# Patient Record
Sex: Female | Born: 1960 | Race: White | Hispanic: No | Marital: Married | Smoking: Never smoker
Health system: Southern US, Community
[De-identification: ages and names within clinical notes are randomized; demographics above are authoritative.]

## PROBLEM LIST (undated history)

## (undated) DIAGNOSIS — Z87442 Personal history of urinary calculi: Secondary | ICD-10-CM

## (undated) DIAGNOSIS — E785 Hyperlipidemia, unspecified: Secondary | ICD-10-CM

## (undated) DIAGNOSIS — C44602 Unspecified malignant neoplasm of skin of right upper limb, including shoulder: Secondary | ICD-10-CM

## (undated) DIAGNOSIS — B019 Varicella without complication: Secondary | ICD-10-CM

## (undated) DIAGNOSIS — J101 Influenza due to other identified influenza virus with other respiratory manifestations: Secondary | ICD-10-CM

## (undated) HISTORY — DX: Hyperlipidemia, unspecified: E78.5

## (undated) HISTORY — DX: Personal history of urinary calculi: Z87.442

## (undated) HISTORY — PX: TUBAL LIGATION: SHX77

## (undated) HISTORY — DX: Varicella without complication: B01.9

---
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
## 2014-07-20 HISTORY — PX: ABLATION: SHX5711

## 2015-08-08 LAB — HM PAP SMEAR: HM PAP: NORMAL

## 2015-08-21 HISTORY — PX: SKIN CANCER EXCISION: SHX779

## 2015-08-26 LAB — HM COLONOSCOPY

## 2016-11-05 ENCOUNTER — Encounter: Payer: Self-pay | Admitting: Primary Care

## 2016-11-05 ENCOUNTER — Ambulatory Visit (INDEPENDENT_AMBULATORY_CARE_PROVIDER_SITE_OTHER): Payer: Managed Care, Other (non HMO) | Admitting: Primary Care

## 2016-11-05 VITALS — BP 126/84 | HR 63 | Temp 97.9°F | Ht 67.25 in | Wt 189.8 lb

## 2016-11-05 DIAGNOSIS — E782 Mixed hyperlipidemia: Secondary | ICD-10-CM

## 2016-11-05 DIAGNOSIS — E785 Hyperlipidemia, unspecified: Secondary | ICD-10-CM | POA: Insufficient documentation

## 2016-11-05 DIAGNOSIS — G8929 Other chronic pain: Secondary | ICD-10-CM | POA: Diagnosis not present

## 2016-11-05 DIAGNOSIS — M25511 Pain in right shoulder: Secondary | ICD-10-CM

## 2016-11-05 NOTE — Assessment & Plan Note (Signed)
Intermittent for 2 years, constant for 6 months. Given consistent symptoms without improvement, will send to ortho for further evaluation. She has CD of MRI from early 2018.

## 2016-11-05 NOTE — Assessment & Plan Note (Signed)
Slightly above goal. Will have her work on diet and exercise.  Check lipids later this Summer during CPE.

## 2016-11-05 NOTE — Patient Instructions (Signed)
Stop by the front desk and speak with either Rosaria Ferries or Shirlean Mylar regarding your referral to Orthopedics.  Please schedule a physical with me in a few months. You may also schedule a lab only appointment 3-4 days prior. We will discuss your lab results in detail during your physical.  It was a pleasure to meet you today! Please don't hesitate to call me with any questions. Welcome to [facility]!

## 2016-11-05 NOTE — Progress Notes (Signed)
Subjective:    Patient ID: Brianna Tanner, female    DOB: 1960/10/30, 56 y.o.   MRN: [id number]  HPI  [name] is a 56 year old female who presents today to establish care and discuss the problems mentioned below. Will obtain old records. Her last physical was in March   1) Shoulder Pain: Chronic pain to right shoulder for the past 1-2 years, since October 2017 her pain has been consistent. She underwent physical therapy several years ago with improvement. She underwent MRI about 1 month ago which showed frozen shoulder. She's had no improvement with home exercises, Meloxicam without much improvement. Her prior PCP recommended she see an orthopedist for further evaluation given lack of improvement. She moved from [place] 1 month ago. She denies numbness/tingling, weakness.   2) Hyperlipidemia: History of borderline levels in the past. Completed a biometric screening for work in January 2018 with TC of 252, LDL of 158, HDL of 58, Triglycerides if 198. She is working to improve her diet and plans on exercising.  Poor diet in January 2018, improved over the last 1 month. Her diet currently consists of:  Breakfast: Coffee, fruit, granola cereal, cheese, eggs/bacon Lunch: Chicken, vegetables. Dinner: Chicken, vegetables. Snacks: Occasionally, nuts, fruit Desserts: Has not had sweets in 1 month Beverages: Coffee, water, un-sweet tea  Exercise: She does not currently exercise.    Review of Systems  Respiratory: Negative for shortness of breath.   Cardiovascular: Negative for chest pain.  Musculoskeletal: Positive for arthralgias.  Neurological: Negative for weakness and numbness.       Past Medical History:  Diagnosis Date  . Chickenpox   . History of kidney stones   . Hyperlipidemia      Social History   Social History  . Marital status: Married    Spouse name: N/A  . Number of children: N/A  . Years of education: N/A   Occupational History  . Not on file.     Social History Main Topics  . Smoking status: Never Smoker  . Smokeless tobacco: Never Used  . Alcohol use Yes  . Drug use: Unknown  . Sexual activity: Not on file   Other Topics Concern  . Not on file   Social History Narrative   Married.   3 children.   Moved from [place].   Works at [employer]   Enjoys antiquing, hiking.     Past Surgical History:  Procedure Laterality Date  . ABLATION  2016  . SKIN CANCER EXCISION  08/2015    Family History  Problem Relation Age of Onset  . Uterine cancer Mother   . Arthritis Father   . Stroke Father   . Hypertension Father   . Hyperlipidemia Brother   . Stroke Brother     No Known Allergies  No current outpatient prescriptions on file prior to visit.   No current facility-administered medications on file prior to visit.     BP 126/84   Pulse 63   Temp 97.9 F (36.6 C) (Oral)   Ht 5' 7.25" (1.708 m)   Wt 189 lb 12.8 oz (86.1 kg)   SpO2 95%   BMI 29.51 kg/m    Objective:   Physical Exam  Constitutional: She appears well-nourished.  Neck: Neck supple.  Cardiovascular: Normal rate and regular rhythm.   Pulmonary/Chest: Effort normal and breath sounds normal.  Musculoskeletal:       Right shoulder: She exhibits decreased range of motion and pain.  Skin: Skin is  warm and dry.          Assessment & Plan:

## 2017-02-25 ENCOUNTER — Other Ambulatory Visit: Payer: Self-pay | Admitting: Primary Care

## 2017-02-25 DIAGNOSIS — E785 Hyperlipidemia, unspecified: Secondary | ICD-10-CM

## 2017-03-04 ENCOUNTER — Encounter (INDEPENDENT_AMBULATORY_CARE_PROVIDER_SITE_OTHER): Payer: Self-pay

## 2017-03-04 ENCOUNTER — Other Ambulatory Visit (INDEPENDENT_AMBULATORY_CARE_PROVIDER_SITE_OTHER): Payer: Managed Care, Other (non HMO)

## 2017-03-04 DIAGNOSIS — E785 Hyperlipidemia, unspecified: Secondary | ICD-10-CM

## 2017-03-04 LAB — LIPID PANEL
CHOLESTEROL: 187 mg/dL (ref 0–200)
HDL: 45.8 mg/dL (ref >39.00)
LDL Cholesterol: 117 mg/dL — ABNORMAL HIGH (ref 0–99)
NonHDL: 141.27
TRIGLYCERIDES: 123 mg/dL (ref 0.0–149.0)
Total CHOL/HDL Ratio: 4
VLDL: 24.6 mg/dL (ref 0.0–40.0)

## 2017-03-04 LAB — COMPREHENSIVE METABOLIC PANEL
ALBUMIN: 4.1 g/dL (ref 3.5–5.2)
ALK PHOS: 34 U/L — AB (ref 39–117)
ALT: 11 U/L (ref 0–35)
AST: 14 U/L (ref 0–37)
BILIRUBIN TOTAL: 0.5 mg/dL (ref 0.2–1.2)
BUN: 14 mg/dL (ref 6–23)
CALCIUM: 9.1 mg/dL (ref 8.4–10.5)
CO2: 28 mEq/L (ref 19–32)
Chloride: 105 mEq/L (ref 96–112)
Creatinine, Ser: 0.69 mg/dL (ref 0.40–1.20)
GFR: 93.52 mL/min (ref >60.00)
Glucose, Bld: 93 mg/dL (ref 70–99)
POTASSIUM: 3.7 meq/L (ref 3.5–5.1)
Sodium: 139 mEq/L (ref 135–145)
TOTAL PROTEIN: 6.2 g/dL (ref 6.0–8.3)

## 2017-03-09 ENCOUNTER — Encounter: Payer: Managed Care, Other (non HMO) | Admitting: Primary Care

## 2017-03-17 ENCOUNTER — Encounter: Payer: Self-pay | Admitting: Primary Care

## 2017-03-17 ENCOUNTER — Ambulatory Visit (INDEPENDENT_AMBULATORY_CARE_PROVIDER_SITE_OTHER): Payer: Managed Care, Other (non HMO) | Admitting: Primary Care

## 2017-03-17 VITALS — BP 118/78 | HR 66 | Temp 98.0°F | Ht 67.0 in | Wt 175.8 lb

## 2017-03-17 DIAGNOSIS — Z0000 Encounter for general adult medical examination without abnormal findings: Secondary | ICD-10-CM

## 2017-03-17 DIAGNOSIS — E782 Mixed hyperlipidemia: Secondary | ICD-10-CM | POA: Diagnosis not present

## 2017-03-17 DIAGNOSIS — Z0001 Encounter for general adult medical examination with abnormal findings: Secondary | ICD-10-CM | POA: Insufficient documentation

## 2017-03-17 DIAGNOSIS — Z23 Encounter for immunization: Secondary | ICD-10-CM

## 2017-03-17 NOTE — Progress Notes (Signed)
Subjective:    Patient ID: Brianna Tanner, female    DOB: 07/13/1961, 56 y.o.   MRN: [id number]  HPI  [name] is a 56 year old female who presents today for complete physical.  Immunizations: -Tetanus: Believes it's been over 10 years. -Influenza: Due this season.   Diet: She endorses a healthy diet. She's been participating in [employer] watchers for the past 2 months. Breakfast: Yogurt, fruit, honey, english muffin with egg/turkey Lunch: Salads, chicken, chili, fruit, veggies Dinner: Meat, pasta, chili, starch  Snacks: Fruit, nuts, chips, some cheese Desserts: 1-2 times monthly with cake/cheesecake; fruit Beverages: Water, unsweet tea, coffee  Exercise: She does not regularly exercise Eye exam: Completed in 2018 Dental exam: Due. Colonoscopy: Completed in 2017, due in 10 years Pap Smear: Completed in 2017, normal Mammogram: Follows with GYN.  Review of Systems  Constitutional: Negative for unexpected weight change.  HENT: Negative for rhinorrhea.   Respiratory: Negative for cough and shortness of breath.   Cardiovascular: Negative for chest pain.  Gastrointestinal: Negative for constipation and diarrhea.  Genitourinary: Negative for difficulty urinating and menstrual problem.  Musculoskeletal: Negative for arthralgias and myalgias.  Skin: Negative for rash.  Allergic/Immunologic: Negative for environmental allergies.  Neurological: Negative for dizziness, numbness and headaches.  Psychiatric/Behavioral:       Denies anxiety and depression       Past Medical History:  Diagnosis Date  . Chickenpox   . History of kidney stones   . Hyperlipidemia      Social History   Social History  . Marital status: Married    Spouse name: N/A  . Number of children: N/A  . Years of education: N/A   Occupational History  . Not on file.   Social History Main Topics  . Smoking status: Never Smoker  . Smokeless tobacco: Never Used  . Alcohol use Yes  . Drug  use: Unknown  . Sexual activity: Not on file   Other Topics Concern  . Not on file   Social History Narrative   Married.   3 children.   Moved from [place].   Works at [employer]   Enjoys antiquing, hiking.     Past Surgical History:  Procedure Laterality Date  . ABLATION  2016  . SKIN CANCER EXCISION  08/2015    Family History  Problem Relation Age of Onset  . Uterine cancer Mother   . Arthritis Father   . Stroke Father   . Hypertension Father   . Hyperlipidemia Brother   . Stroke Brother     No Known Allergies  No current outpatient prescriptions on file prior to visit.   No current facility-administered medications on file prior to visit.     BP 118/78   Pulse 66   Temp 98 F (36.7 C) (Oral)   Ht 5\' 7"  (1.702 m)   Wt 175 lb 12.8 oz (79.7 kg)   SpO2 98%   BMI 27.53 kg/m    Objective:   Physical Exam  Constitutional: She is oriented to person, place, and time. She appears well-nourished.  HENT:  Right Ear: Tympanic membrane and ear canal normal.  Left Ear: Tympanic membrane and ear canal normal.  Nose: Nose normal.  Mouth/Throat: Oropharynx is clear and moist.  Eyes: Pupils are equal, round, and reactive to light. Conjunctivae and EOM are normal.  Neck: Neck supple. No thyromegaly present.  Cardiovascular: Normal rate and regular rhythm.   No murmur heard. Pulmonary/Chest: Effort normal and breath sounds normal.  She has no rales.  Abdominal: Soft. Bowel sounds are normal. There is no tenderness.  Musculoskeletal: Normal range of motion.  Lymphadenopathy:    She has no cervical adenopathy.  Neurological: She is alert and oriented to person, place, and time. She has normal reflexes. No cranial nerve deficit.  Skin: Skin is warm and dry. No rash noted.  Psychiatric: She has a normal mood and affect.          Assessment & Plan:

## 2017-03-17 NOTE — Assessment & Plan Note (Signed)
Tetanus due, provided today. Recommended regular exercise, commended her on her weight loss.  Mammogram and Pap UTD. Colonoscopy UTD. Exam unremarkable. Labs unremarkable. Follow up in 1 year.

## 2017-03-17 NOTE — Assessment & Plan Note (Signed)
Improved on recently labs, likely from weight loss through [employer]. Commended her on this success.

## 2017-03-17 NOTE — Addendum Note (Signed)
Addended by: Jacqualin Combes on: 03/17/2017 12:45 PM   Modules accepted: Orders

## 2017-03-17 NOTE — Patient Instructions (Signed)
Start exercising. You should be getting 150 minutes of moderate intensity exercise weekly.  Continue your efforts towards a healthy diet. Congratulations on your weight loss!  You were provided with a tetanus vaccination which will cover you for 10 years.  Follow up in 1 year for your annual exam or sooner if needed.  It was a pleasure to see you today!

## 2017-06-17 ENCOUNTER — Ambulatory Visit: Payer: Managed Care, Other (non HMO) | Admitting: Family Medicine

## 2017-06-17 ENCOUNTER — Encounter: Payer: Self-pay | Admitting: Family Medicine

## 2017-06-17 VITALS — BP 120/80 | HR 90 | Temp 98.5°F | Ht 67.0 in | Wt 175.5 lb

## 2017-06-17 DIAGNOSIS — J329 Chronic sinusitis, unspecified: Secondary | ICD-10-CM

## 2017-06-17 MED ORDER — BENZONATATE 100 MG PO CAPS: 100.0000 mg | ORAL_CAPSULE | Freq: Three times a day (TID) | ORAL | 0 refills | Status: DC | PRN

## 2017-06-17 MED ORDER — AMOXICILLIN-POT CLAVULANATE 875-125 MG PO TABS: 1.0000 | ORAL_TABLET | Freq: Two times a day (BID) | ORAL | 0 refills | Status: DC

## 2017-06-17 NOTE — Patient Instructions (Signed)
Take the antibiotic (Augmentin )as instructed.  Use the Tessalon as needed for cough per instructions.  I hope you are feeling better soon! Seek care promptly if your symptoms worsen, new concerns arise or you are not improving with treatment.

## 2017-06-17 NOTE — Progress Notes (Signed)
   HPI:  Acute visit for respiratory illness: -started:3-4 weeks ago with a cold nasal congestion, sore throat, cough -never went away and then got worse with thicker yellow to green mucus, sinus pressure and pain, malaise -denies:fever, SOB, NVD, tooth pain -has tried: nothing -sick contacts/travel/risks: no reported flu, strep or tick exposure - husband had it but symptoms resolved  ROS: See pertinent positives and negatives per HPI.  Past Medical History:  Diagnosis Date  . Chickenpox   . History of kidney stones   . Hyperlipidemia     Past Surgical History:  Procedure Laterality Date  . ABLATION  2016  . SKIN CANCER EXCISION  08/2015    Family History  Problem Relation Age of Onset  . Uterine cancer Mother   . Arthritis Father   . Stroke Father   . Hypertension Father   . Hyperlipidemia Brother   . Stroke Brother     Social History   Socioeconomic History  . Marital status: Married    Spouse name: None  . Number of children: None  . Years of education: None  . Highest education level: None  Social Needs  . Financial resource strain: None  . Food insecurity - worry: None  . Food insecurity - inability: None  . Transportation needs - medical: None  . Transportation needs - non-medical: None  Occupational History  . None  Tobacco Use  . Smoking status: Never Smoker  . Smokeless tobacco: Never Used  Substance and Sexual Activity  . Alcohol use: Yes  . Drug use: None  . Sexual activity: None  Other Topics Concern  . None  Social History Narrative   Married.   3 children.   Moved from [place].   Works at [employer]   Enjoys antiquing, hiking.      Current Outpatient Medications:  .  amoxicillin-clavulanate (AUGMENTIN) 875-125 MG tablet, Take 1 tablet by mouth 2 (two) times daily., Disp: 20 tablet, Rfl: 0 .  benzonatate (TESSALON PERLES) 100 MG capsule, Take 1 capsule (100 mg total) by mouth 3 (three) times daily as needed., Disp: 20 capsule, Rfl:  0  EXAM:  Vitals:   06/17/17 1558  BP: 120/80  Pulse: 90  Temp: 98.5 F (36.9 C)  SpO2: 97%    Body mass index is 27.49 kg/m.  GENERAL: vitals reviewed and listed above, alert, oriented, appears well hydrated and in no acute distress  HEENT: atraumatic, conjunttiva clear, no obvious abnormalities on inspection of external nose and ears, normal appearance of ear canals and TMs, thicknasal congestion, mild post oropharyngeal erythema with PND, no tonsillar edema or exudate, no sinus TTP  NECK: no obvious masses on inspection  LUNGS: clear to auscultation bilaterally, no wheezes, rales or rhonchi, good air movement  CV: HRRR, no peripheral edema  MS: moves all extremities without noticeable abnormality  PSYCH: pleasant and cooperative, no obvious depression or anxiety  ASSESSMENT AND PLAN:  Discussed the following assessment and plan:  Sinusitis, unspecified chronicity, unspecified location  -Antibiotic after discussion of risks/benefits -Tessalon for cough -of course, we advised to return or notify a doctor immediately if symptoms worsen or persist or new concerns arise.    Patient Instructions  Take the antibiotic (Augmentin )as instructed.  Use the Tessalon as needed for cough per instructions.  I hope you are feeling better soon! Seek care promptly if your symptoms worsen, new concerns arise or you are not improving with treatment.     Colin Benton R., DO

## 2017-06-22 ENCOUNTER — Ambulatory Visit (INDEPENDENT_AMBULATORY_CARE_PROVIDER_SITE_OTHER)
Admission: RE | Admit: 2017-06-22 | Discharge: 2017-06-22 | Disposition: A | Discharge status: Home / Self Care | Payer: Managed Care, Other (non HMO) | Source: Ambulatory Visit | Attending: Family Medicine | Admitting: Family Medicine

## 2017-06-22 ENCOUNTER — Encounter: Payer: Self-pay | Admitting: Family Medicine

## 2017-06-22 ENCOUNTER — Ambulatory Visit: Payer: Managed Care, Other (non HMO) | Admitting: Family Medicine

## 2017-06-22 VITALS — BP 100/60 | HR 90 | Temp 99.0°F | Ht 67.0 in | Wt 174.1 lb

## 2017-06-22 DIAGNOSIS — R059 Cough, unspecified: Secondary | ICD-10-CM

## 2017-06-22 DIAGNOSIS — R05 Cough: Secondary | ICD-10-CM | POA: Diagnosis not present

## 2017-06-22 LAB — POC INFLUENZA A&B (BINAX/QUICKVUE)
Influenza A, POC: NEGATIVE
Influenza B, POC: NEGATIVE

## 2017-06-22 MED ORDER — PREDNISONE 20 MG PO TABS: 40.0000 mg | ORAL_TABLET | Freq: Every day | ORAL | 0 refills | Status: DC

## 2017-06-22 MED ORDER — BENZONATATE 100 MG PO CAPS: 100.0000 mg | ORAL_CAPSULE | Freq: Three times a day (TID) | ORAL | 0 refills | Status: DC | PRN

## 2017-06-22 NOTE — Addendum Note (Signed)
Addended by: Agnes Lawrence on: 06/22/2017 03:58 PM   Modules accepted: Orders

## 2017-06-22 NOTE — Progress Notes (Signed)
HPI:  Acute visit for respiratory illness: -started: 4-5 days ago  -symptoms: Think she felt better on the antibiotic, but then 4-5 days ago seem to get cough with wheeziness to the cough per her report, the cough never completely went away, she also had some fullness in the right ear and at one point felt like she pulsing sound in that ear, she had a headache, she threw up once for 5 days ago and felt like she had some fevers for a few nights had was 100, still feels like has a runny nose times -denies:fever, SOB, NVD, tooth pain -has tried: see above -sick contacts/travel/risks: no reported flu, strep or tick exposure  ROS: See pertinent positives and negatives per HPI.  Past Medical History:  Diagnosis Date  . Chickenpox   . History of kidney stones   . Hyperlipidemia     Past Surgical History:  Procedure Laterality Date  . ABLATION  2016  . SKIN CANCER EXCISION  08/2015    Family History  Problem Relation Age of Onset  . Uterine cancer Mother   . Arthritis Father   . Stroke Father   . Hypertension Father   . Hyperlipidemia Brother   . Stroke Brother     Social History   Socioeconomic History  . Marital status: Married    Spouse name: None  . Number of children: None  . Years of education: None  . Highest education level: None  Social Needs  . Financial resource strain: None  . Food insecurity - worry: None  . Food insecurity - inability: None  . Transportation needs - medical: None  . Transportation needs - non-medical: None  Occupational History  . None  Tobacco Use  . Smoking status: Never Smoker  . Smokeless tobacco: Never Used  Substance and Sexual Activity  . Alcohol use: Yes  . Drug use: None  . Sexual activity: None  Other Topics Concern  . None  Social History Narrative   Married.   3 children.   Moved from [place].   Works at [employer]   Enjoys antiquing, hiking.      Current Outpatient Medications:  .  amoxicillin-clavulanate  (AUGMENTIN) 875-125 MG tablet, Take 1 tablet by mouth 2 (two) times daily., Disp: 20 tablet, Rfl: 0 .  benzonatate (TESSALON PERLES) 100 MG capsule, Take 1 capsule (100 mg total) by mouth 3 (three) times daily as needed., Disp: 20 capsule, Rfl: 0 .  predniSONE (DELTASONE) 20 MG tablet, Take 2 tablets (40 mg total) by mouth daily with breakfast., Disp: 8 tablet, Rfl: 0  EXAM:  Vitals:   06/22/17 1516  BP: 100/60  Pulse: 90  Temp: 99 F (37.2 C)  SpO2: 96%    Body mass index is 27.27 kg/m.  GENERAL: vitals reviewed and listed above, alert, oriented, appears well hydrated and in no acute distress  HEENT: atraumatic, conjunttiva clear, no obvious abnormalities on inspection of external nose and ears, normal appearance of ear canals and TMs, clear nasal congestion, mild post oropharyngeal erythema with PND, no tonsillar edema or exudate, no sinus TTP  NECK: no obvious masses on inspection  LUNGS: clear to auscultation bilaterally, no wheezes, rales or rhonchi, good air movement  CV: HRRR, no peripheral edema  MS: moves all extremities without noticeable abnormality  PSYCH: pleasant and cooperative, no obvious depression or anxiety  ASSESSMENT AND PLAN:  Discussed the following assessment and plan:  Cough - Plan: DG Chest 2 View  -It sounds like she  might of gotten another illness, perhaps a flu or another viral illness -We will get a chest x-ray to make sure there is no lower respiratory tract infection, will treat with another antibiotic if this is a case -Prednisone for the cough which has lingered -Rapid flu test today - thought does not want tamiflu if positive, more diagnostic -Clear effusion of the right ear is likely because of the sounds in her ear, though did advise ear nose and throat evaluation if she has any more pulsatile symptoms in that ear, number provided to call -Like the Tessalon for cough, we will give her a refill on this -Advised follow-up with her primary  doctor next week for recheck -of course, we advised to return or notify a doctor immediately if symptoms worsen or persist or new concerns arise.    Patient Instructions  BEFORE YOU LEAVE: -xray sheet -flu test -follow up: next week with your doctor in about 1 week  Go get the xray.  Take the prednisone as prescribed.  Tessalon as needed.  See the ear, nose and throat specialist if any further symptoms with the ear.  I hope you are feeling better soon! Seek care promptly if your symptoms worsen, new concerns arise or you are not improving with treatment.      Colin Benton R., DO

## 2017-06-22 NOTE — Patient Instructions (Signed)
BEFORE YOU LEAVE: -xray sheet -flu test -follow up: next week with your doctor in about 1 week  Go get the xray.  Take the prednisone as prescribed.  Tessalon as needed.  See the ear, nose and throat specialist if any further symptoms with the ear.  I hope you are feeling better soon! Seek care promptly if your symptoms worsen, new concerns arise or you are not improving with treatment.

## 2017-07-28 ENCOUNTER — Encounter: Payer: Self-pay | Admitting: Family

## 2017-07-28 ENCOUNTER — Ambulatory Visit (INDEPENDENT_AMBULATORY_CARE_PROVIDER_SITE_OTHER): Payer: Managed Care, Other (non HMO) | Admitting: Family

## 2017-07-28 ENCOUNTER — Telehealth: Payer: Self-pay | Admitting: Family

## 2017-07-28 VITALS — BP 104/72 | HR 99 | Temp 98.6°F | Ht 67.0 in | Wt 177.0 lb

## 2017-07-28 DIAGNOSIS — H5711 Ocular pain, right eye: Secondary | ICD-10-CM

## 2017-07-28 NOTE — Telephone Encounter (Signed)
Patient came to office today with concerns that she has scratched her eye or "has something in it." Complaining of pain, increased watering, light sensitivity; I cancelled her appointment here today and refunded her money; recommended that she see her eye doctor ASAP. She agrees to call and schedule appointment as soon as she leaves our office.

## 2017-07-29 NOTE — Progress Notes (Signed)
error 

## 2017-07-29 NOTE — Patient Instructions (Signed)
Scheduled in error.

## 2018-10-05 ENCOUNTER — Encounter: Payer: Self-pay | Admitting: Family Medicine

## 2018-10-05 ENCOUNTER — Other Ambulatory Visit: Payer: Self-pay

## 2018-10-05 ENCOUNTER — Ambulatory Visit: Payer: 59 | Admitting: Family Medicine

## 2018-10-05 VITALS — BP 122/82 | HR 73 | Temp 98.5°F | Ht 67.0 in | Wt 189.0 lb

## 2018-10-05 DIAGNOSIS — R059 Cough, unspecified: Secondary | ICD-10-CM

## 2018-10-05 DIAGNOSIS — J101 Influenza due to other identified influenza virus with other respiratory manifestations: Secondary | ICD-10-CM

## 2018-10-05 DIAGNOSIS — R05 Cough: Secondary | ICD-10-CM

## 2018-10-05 DIAGNOSIS — R52 Pain, unspecified: Secondary | ICD-10-CM | POA: Diagnosis not present

## 2018-10-05 HISTORY — DX: Influenza due to other identified influenza virus with other respiratory manifestations: J10.1

## 2018-10-05 LAB — POC INFLUENZA A&B (BINAX/QUICKVUE)
Influenza A, POC: POSITIVE — AB
Influenza B, POC: NEGATIVE

## 2018-10-05 NOTE — Patient Instructions (Addendum)
Flu swab today very faintly positive for flu A.  Push fluids and plenty of rest. Please let us know if you are not improving as expected, or if you have high fevers (>101.5) or difficulty swallowing or worsening productive cough.  Call clinic with questions.  Good to see you today. I hope you start feeling better soon.

## 2018-10-05 NOTE — Progress Notes (Signed)
BP 122/82 (BP Location: Left Arm, Patient Position: Sitting, Cuff Size: Normal)   Pulse 73   Temp 98.5 F (36.9 C) (Oral)   Ht 5\' 7"  (1.702 m)   Wt 189 lb (85.7 kg)   SpO2 96%   BMI 29.60 kg/m    CC: cough Subjective:    Patient ID: Brianna Tanner, female    DOB: 03/30/1961, 58 y.o.   MRN: [id number]  HPI: Brianna Tanner is a 58 y.o. female presenting on 10/05/2018 for Cough (C/o cough, chest congestion, body aches and HA. Sxs started 1 wk ago  Pt recently traveled to [facility]. Pt accompanied by husband, Richard. )   Here with husband who is also seen.   1 wk h/o dry non productive cough, chest congestion, back pain, headache. Last 2 nights of cough seem to be improving. Some dyspnea with cough. Coughing fits started yesterday. Body aches present as well. Some sneezing but not significant nasal congestion. Chest > head congestion.   Has been trying cough drops, nyquil, mucinex with benefit.   No fevers/chills, ear or tooth pain, ST, PNdrainage, wheezing.  Husband sick as well. No h/o asthma. No smokers at home.   Recent sudden 1d trip to [place], [place] (there and arrived back at 1am this morning) to pick up sister in law who was stuck in [facility].      Relevant past medical, surgical, family and social history reviewed and updated as indicated. Interim medical history since our last visit reviewed. Allergies and medications reviewed and updated. No outpatient medications prior to visit.   No facility-administered medications prior to visit.      Per HPI unless specifically indicated in ROS section below Review of Systems Objective:    BP 122/82 (BP Location: Left Arm, Patient Position: Sitting, Cuff Size: Normal)   Pulse 73   Temp 98.5 F (36.9 C) (Oral)   Ht 5\' 7"  (1.702 m)   Wt 189 lb (85.7 kg)   SpO2 96%   BMI 29.60 kg/m   Wt Readings from Last 3 Encounters:  10/05/18 189 lb (85.7 kg)  07/28/17 177 lb (80.3 kg)  06/22/17 174 lb 1.6 oz (79 kg)     Physical Exam Vitals signs and nursing note reviewed.  Constitutional:      General: She is not in acute distress.    Appearance: Normal appearance. She is well-developed.  HENT:     Head: Normocephalic and atraumatic.     Right Ear: Hearing, tympanic membrane, ear canal and external ear normal.     Left Ear: Hearing, tympanic membrane, ear canal and external ear normal.     Nose: Congestion (mild) present. No mucosal edema or rhinorrhea.     Right Sinus: No maxillary sinus tenderness or frontal sinus tenderness.     Left Sinus: No maxillary sinus tenderness or frontal sinus tenderness.     Mouth/Throat:     Mouth: Mucous membranes are moist.     Pharynx: Uvula midline. Posterior oropharyngeal erythema present. No oropharyngeal exudate.     Tonsils: No tonsillar abscesses.  Eyes:     General: No scleral icterus.    Conjunctiva/sclera: Conjunctivae normal.     Pupils: Pupils are equal, round, and reactive to light.  Neck:     Musculoskeletal: Normal range of motion and neck supple.  Cardiovascular:     Rate and Rhythm: Normal rate and regular rhythm.     Pulses: Normal pulses.     Heart sounds: Normal heart sounds. No  murmur.  Pulmonary:     Effort: Pulmonary effort is normal. No respiratory distress.     Breath sounds: Normal breath sounds. No wheezing, rhonchi or rales.     Comments: Lungs clear, dry irritative cough present Lymphadenopathy:     Cervical: No cervical adenopathy.  Skin:    General: Skin is warm and dry.     Findings: No rash.  Neurological:     Mental Status: She is alert.       Results for orders placed or performed in visit on 10/05/18  POC Influenza A&B(BINAX/QUICKVUE)  Result Value Ref Range   Influenza A, POC Positive (A) Negative   Influenza B, POC Negative Negative   Assessment & Plan:   Problem List Items Addressed This Visit    Influenza A - Primary    Flu swab very faintly positive for A.  Symptoms ongoing for the past week - likely  outside of window of benefit for tamiflu.  Supportive care reviewed, red fiags to seek further care reviewed. Pt declines cough syrup at this time.        Other Visit Diagnoses    Cough       Relevant Orders   POC Influenza A&B(BINAX/QUICKVUE) (Completed)   Generalized body aches       Relevant Orders   POC Influenza A&B(BINAX/QUICKVUE) (Completed)       No orders of the defined types were placed in this encounter.  Orders Placed This Encounter  Procedures  . POC Influenza A&B(BINAX/QUICKVUE)    Patient Instructions  Flu swab today very faintly positive for flu A.  Push fluids and plenty of rest. Please let us know if you are not improving as expected, or if you have high fevers (>101.5) or difficulty swallowing or worsening productive cough.  Call clinic with questions.  Good to see you today. I hope you start feeling better soon.    Follow up plan: Return if symptoms worsen or fail to improve.  Ria Bush, MD

## 2018-10-05 NOTE — Assessment & Plan Note (Signed)
Flu swab very faintly positive for A.  Symptoms ongoing for the past week - likely outside of window of benefit for tamiflu.  Supportive care reviewed, red fiags to seek further care reviewed. Pt declines cough syrup at this time.

## 2019-04-06 ENCOUNTER — Encounter: Payer: Self-pay | Admitting: Primary Care

## 2019-04-06 ENCOUNTER — Other Ambulatory Visit: Payer: Self-pay

## 2019-04-06 ENCOUNTER — Ambulatory Visit (INDEPENDENT_AMBULATORY_CARE_PROVIDER_SITE_OTHER): Payer: No Typology Code available for payment source | Admitting: Primary Care

## 2019-04-06 ENCOUNTER — Ambulatory Visit: Payer: 59 | Admitting: Primary Care

## 2019-04-06 DIAGNOSIS — H9203 Otalgia, bilateral: Secondary | ICD-10-CM

## 2019-04-06 DIAGNOSIS — H9209 Otalgia, unspecified ear: Secondary | ICD-10-CM | POA: Insufficient documentation

## 2019-04-06 MED ORDER — FLUTICASONE PROPIONATE 50 MCG/ACT NA SUSP: 1.0000 | Freq: Two times a day (BID) | NASAL | 0 refills | Status: DC

## 2019-04-06 NOTE — Progress Notes (Signed)
Subjective:    Patient ID: Brianna Tanner, female    DOB: [birth year], 58 y.o.   MRN: [id number]  HPI  [name] is a 58 year old female who presents today with a chief complaint of otalgia.  Her pain is located to the bilateral ears which began in late February 2020. She describes her symptoms as "throbbing in my ear drums", hearing a "tone" in her ears, pressure and achy feeling to ears. Her ear pressure is constant, throbbing is intermittent. Symptoms will prevent her from sleeping at times.  She's not taken anything OTC for symptoms. Denies history of allergic rhinitis.   Review of Systems  Constitutional: Negative for fever.  HENT: Positive for ear pain. Negative for congestion, ear discharge, postnasal drip and sore throat.   Respiratory: Negative for cough.   Allergic/Immunologic: Negative for environmental allergies.       Past Medical History:  Diagnosis Date  . Chickenpox   . History of kidney stones   . Hyperlipidemia      Social History   Socioeconomic History  . Marital status: Married    Spouse name: Not on file  . Number of children: Not on file  . Years of education: Not on file  . Highest education level: Not on file  Occupational History  . Not on file  Social Needs  . Financial resource strain: Not on file  . Food insecurity    Worry: Not on file    Inability: Not on file  . Transportation needs    Medical: Not on file    Non-medical: Not on file  Tobacco Use  . Smoking status: Never Smoker  . Smokeless tobacco: Never Used  Substance and Sexual Activity  . Alcohol use: Yes  . Drug use: Not on file  . Sexual activity: Not on file  Lifestyle  . Physical activity    Days per week: Not on file    Minutes per session: Not on file  . Stress: Not on file  Relationships  . Social Herbalist on phone: Not on file    Gets together: Not on file    Attends religious service: Not on file    Active member of club or  organization: Not on file    Attends meetings of clubs or organizations: Not on file    Relationship status: Not on file  . Intimate partner violence    Fear of current or ex partner: Not on file    Emotionally abused: Not on file    Physically abused: Not on file    Forced sexual activity: Not on file  Other Topics Concern  . Not on file  Social History Narrative   Married.   3 children.   Moved from [place].   Works at [employer]   Enjoys antiquing, hiking.     Past Surgical History:  Procedure Laterality Date  . ABLATION  2016  . SKIN CANCER EXCISION  08/2015    Family History  Problem Relation Age of Onset  . Uterine cancer Mother   . Arthritis Father   . Stroke Father   . Hypertension Father   . Hyperlipidemia Brother   . Stroke Brother     No Known Allergies  No current outpatient medications on file prior to visit.   No current facility-administered medications on file prior to visit.     BP 128/86   Pulse 67   Temp 97.8 F (36.6 C) (Temporal)  Ht 5\' 7"  (1.702 m)   Wt 188 lb (85.3 kg)   SpO2 96%   BMI 29.44 kg/m    Objective:   Physical Exam  Constitutional: She appears well-nourished. She does not appear ill.  HENT:  Right Ear: Ear canal normal. Tympanic membrane is not erythematous and not bulging.  Left Ear: Tympanic membrane and ear canal normal. Tympanic membrane is not erythematous and not bulging.  Nose: No mucosal edema. Right sinus exhibits no maxillary sinus tenderness and no frontal sinus tenderness. Left sinus exhibits no maxillary sinus tenderness and no frontal sinus tenderness.  Mild cloudy appearance to right TM  Neck: Neck supple.  Cardiovascular: Normal rate and regular rhythm.  Respiratory: Effort normal and breath sounds normal. She has no wheezes.  Skin: Skin is warm and dry.           Assessment & Plan:

## 2019-04-06 NOTE — Assessment & Plan Note (Signed)
Chronic bilaterally since February 2020, persistent since. Exam today without infection or foreign body. Due to cloudy appearance of right TM we will trial daily Flonase with addition of antihistamine as needed.  Rx for Flonase sent to pharmacy. Discussed to add on antihistamine in 1-2 weeks if no improvement.   Consider ENT evaluation if no improvement.

## 2019-04-06 NOTE — Patient Instructions (Signed)
Ear Pain/Pressure: Try using Flonase (fluticasone) nasal spray. Instill 1 spray in each nostril twice daily.   Add in Zyrtec at bedtime in 1-2 weeks if no improvement.  Please update me in 2-3 weeks as discussed.   It was a pleasure to see you today!

## 2019-04-28 ENCOUNTER — Other Ambulatory Visit: Payer: Self-pay | Admitting: Primary Care

## 2019-04-28 DIAGNOSIS — H9203 Otalgia, bilateral: Secondary | ICD-10-CM

## 2019-05-23 ENCOUNTER — Other Ambulatory Visit: Payer: Self-pay | Admitting: *Deleted

## 2019-05-23 DIAGNOSIS — Z20822 Contact with and (suspected) exposure to covid-19: Secondary | ICD-10-CM

## 2019-05-24 LAB — NOVEL CORONAVIRUS, NAA: SARS-CoV-2, NAA: NOT DETECTED

## 2019-05-26 ENCOUNTER — Ambulatory Visit: Payer: No Typology Code available for payment source | Admitting: Family Medicine

## 2019-06-23 DIAGNOSIS — H9203 Otalgia, bilateral: Secondary | ICD-10-CM

## 2019-06-25 MED ORDER — FLUTICASONE PROPIONATE 50 MCG/ACT NA SUSP: 1.0000 | Freq: Two times a day (BID) | NASAL | 0 refills | Status: DC

## 2019-07-08 DIAGNOSIS — H9209 Otalgia, unspecified ear: Secondary | ICD-10-CM

## 2019-07-10 MED ORDER — MOMETASONE FUROATE 50 MCG/ACT NA SUSP: 1.0000 | Freq: Two times a day (BID) | NASAL | 0 refills | Status: DC | PRN

## 2019-07-10 NOTE — Telephone Encounter (Signed)
I have called [facility] and was told insurance would not cover Flonase but would cover Nasonex (mometasone). Ok to change?

## 2019-07-10 NOTE — Telephone Encounter (Signed)
Prescription changed

## 2020-01-24 ENCOUNTER — Ambulatory Visit: Payer: No Typology Code available for payment source | Admitting: Internal Medicine

## 2020-01-24 ENCOUNTER — Encounter: Payer: Self-pay | Admitting: Primary Care

## 2020-01-24 ENCOUNTER — Other Ambulatory Visit (HOSPITAL_COMMUNITY)
Admission: RE | Admit: 2020-01-24 | Discharge: 2020-01-24 | Disposition: A | Discharge status: Home / Self Care | Payer: No Typology Code available for payment source | Source: Ambulatory Visit | Attending: Primary Care | Admitting: Primary Care

## 2020-01-24 ENCOUNTER — Ambulatory Visit: Payer: No Typology Code available for payment source | Admitting: Primary Care

## 2020-01-24 ENCOUNTER — Other Ambulatory Visit: Payer: Self-pay

## 2020-01-24 VITALS — BP 128/82 | HR 70 | Temp 96.1°F | Ht 67.0 in | Wt 191.2 lb

## 2020-01-24 DIAGNOSIS — Z0000 Encounter for general adult medical examination without abnormal findings: Secondary | ICD-10-CM

## 2020-01-24 DIAGNOSIS — S40861A Insect bite (nonvenomous) of right upper arm, initial encounter: Secondary | ICD-10-CM | POA: Insufficient documentation

## 2020-01-24 DIAGNOSIS — Z23 Encounter for immunization: Secondary | ICD-10-CM

## 2020-01-24 DIAGNOSIS — W57XXXA Bitten or stung by nonvenomous insect and other nonvenomous arthropods, initial encounter: Secondary | ICD-10-CM

## 2020-01-24 DIAGNOSIS — E782 Mixed hyperlipidemia: Secondary | ICD-10-CM | POA: Diagnosis not present

## 2020-01-24 DIAGNOSIS — Z124 Encounter for screening for malignant neoplasm of cervix: Secondary | ICD-10-CM | POA: Diagnosis present

## 2020-01-24 DIAGNOSIS — Z1231 Encounter for screening mammogram for malignant neoplasm of breast: Secondary | ICD-10-CM

## 2020-01-24 DIAGNOSIS — Z114 Encounter for screening for human immunodeficiency virus [HIV]: Secondary | ICD-10-CM

## 2020-01-24 DIAGNOSIS — Z1159 Encounter for screening for other viral diseases: Secondary | ICD-10-CM | POA: Diagnosis not present

## 2020-01-24 LAB — CBC
HCT: 41.7 % (ref 36.0–46.0)
Hemoglobin: 14.2 g/dL (ref 12.0–15.0)
MCHC: 34 g/dL (ref 30.0–36.0)
MCV: 90.9 fl (ref 78.0–100.0)
Platelets: 197 10*3/uL (ref 150.0–400.0)
RBC: 4.59 Mil/uL (ref 3.87–5.11)
RDW: 12.8 % (ref 11.5–15.5)
WBC: 3.8 10*3/uL — ABNORMAL LOW (ref 4.0–10.5)

## 2020-01-24 LAB — COMPREHENSIVE METABOLIC PANEL
ALT: 12 U/L (ref 0–35)
AST: 12 U/L (ref 0–37)
Albumin: 4.9 g/dL (ref 3.5–5.2)
Alkaline Phosphatase: 42 U/L (ref 39–117)
BUN: 17 mg/dL (ref 6–23)
CO2: 28 mEq/L (ref 19–32)
Calcium: 9.4 mg/dL (ref 8.4–10.5)
Chloride: 103 mEq/L (ref 96–112)
Creatinine, Ser: 0.61 mg/dL (ref 0.40–1.20)
GFR: 100.41 mL/min (ref >60.00)
Glucose, Bld: 92 mg/dL (ref 70–99)
Potassium: 4 mEq/L (ref 3.5–5.1)
Sodium: 140 mEq/L (ref 135–145)
Total Bilirubin: 0.5 mg/dL (ref 0.2–1.2)
Total Protein: 6.9 g/dL (ref 6.0–8.3)

## 2020-01-24 LAB — LIPID PANEL
Cholesterol: 232 mg/dL — ABNORMAL HIGH (ref 0–200)
HDL: 48.9 mg/dL (ref >39.00)
NonHDL: 183.55
Total CHOL/HDL Ratio: 5
Triglycerides: 241 mg/dL — ABNORMAL HIGH (ref 0.0–149.0)
VLDL: 48.2 mg/dL — ABNORMAL HIGH (ref 0.0–40.0)

## 2020-01-24 LAB — LDL CHOLESTEROL, DIRECT: Direct LDL: 139 mg/dL

## 2020-01-24 NOTE — Addendum Note (Signed)
Addended by: Jacqualin Combes on: 01/24/2020 04:32 PM   Modules accepted: Orders

## 2020-01-24 NOTE — Addendum Note (Signed)
Addended by: Pilar Grammes on: 01/24/2020 12:56 PM   Modules accepted: Orders

## 2020-01-24 NOTE — Assessment & Plan Note (Signed)
Repeat lipid panel pending today. 

## 2020-01-24 NOTE — Progress Notes (Signed)
Subjective:    Patient ID: Brianna Tanner, female    DOB: 1961-02-02, 59 y.o.   MRN: [id number]  HPI  This visit occurred during the SARS-CoV-2 public health emergency.  Safety protocols were in place, including screening questions prior to the visit, additional usage of staff PPE, and extensive cleaning of exam room while observing appropriate contact time as indicated for disinfecting solutions.   Brianna Tanner is a 59 year old female who presents today with a chief complaint of insect bite and for complete physical. She originally made her appointment for an insect bite, but the bite site has nearly resolved.   Immunizations: -Tetanus: Completed in 2018 -Influenza: Did not complete last season -Shingles: Never completed -Covid-19: Has not completed  Diet: She endorses a fair diet. Exercise: Some walking.  Eye exam: Completed in 2021 Dental exam: Completes annually   Pap Smear: Completed in 2017 Mammogram: Due Colonoscopy: Completed in 2017, due in 2027 Hep C Screen: Due   She was stung by something four days ago while outdoors from the grocery store. Since then she's noticed mild redness, moderate itching, mild tenderness.   She's applied Cortisone cream with some improvement. She denies fevers. Her rash and the bite site have nearly resolved. She denies wheezing, throat tightness, shortness of breath.   Review of Systems  Constitutional: Negative for unexpected weight change.  HENT: Negative for rhinorrhea.   Respiratory: Negative for cough and shortness of breath.   Cardiovascular: Negative for chest pain.  Gastrointestinal: Negative for constipation and diarrhea.  Genitourinary: Negative for difficulty urinating.  Musculoskeletal: Positive for arthralgias.  Skin: Negative for rash.  Allergic/Immunologic: Negative for environmental allergies.  Neurological: Negative for dizziness, numbness and headaches.  Psychiatric/Behavioral: The patient is not  nervous/anxious.        Past Medical History:  Diagnosis Date   Chickenpox    History of kidney stones    Hyperlipidemia    Influenza A 10/05/2018     Social History   Socioeconomic History   Marital status: Married    Spouse name: Not on file   Number of children: Not on file   Years of education: Not on file   Highest education level: Not on file  Occupational History   Not on file  Tobacco Use   Smoking status: Never Smoker   Smokeless tobacco: Never Used  Substance and Sexual Activity   Alcohol use: Yes   Drug use: Not on file   Sexual activity: Not on file  Other Topics Concern   Not on file  Social History Narrative   Married.   3 children.   Moved from [place].   Works at [employer]   Enjoys antiquing, hiking.    Social Determinants of Health   Financial Resource Strain:    Difficulty of Paying Living Expenses:   Food Insecurity:    Worried About Charity fundraiser in the Last Year:    Arboriculturist in the Last Year:   Transportation Needs:    Film/video editor (Medical):    Lack of Transportation (Non-Medical):   Physical Activity:    Days of Exercise per Week:    Minutes of Exercise per Session:   Stress:    Feeling of Stress :   Social Connections:    Frequency of Communication with Friends and Family:    Frequency of Social Gatherings with Friends and Family:    Attends Religious Services:    Active Member of Clubs or  Organizations:    Attends Music therapist:    Marital Status:   Intimate Partner Violence:    Fear of Current or Ex-Partner:    Emotionally Abused:    Physically Abused:    Sexually Abused:     Past Surgical History:  Procedure Laterality Date   ABLATION  2016   SKIN CANCER EXCISION  08/2015    Family History  Problem Relation Age of Onset   Uterine cancer Mother    Arthritis Father    Stroke Father    Hypertension Father    Hyperlipidemia Brother     Stroke Brother     No Known Allergies  Current Outpatient Medications on File Prior to Visit  Medication Sig Dispense Refill   mometasone (NASONEX) 50 MCG/ACT nasal spray Place 1 spray into the nose 2 (two) times daily as needed. 17 g 0   No current facility-administered medications on file prior to visit.    BP 128/82    Pulse 70    Temp (!) 96.1 F (35.6 C) (Temporal)    Ht 5\' 7"  (1.702 m)    Wt 191 lb 4 oz (86.8 kg)    SpO2 96%    BMI 29.95 kg/m    Objective:   Physical Exam HENT:     Right Ear: Tympanic membrane and ear canal normal.     Left Ear: Tympanic membrane and ear canal normal.  Eyes:     Pupils: Pupils are equal, round, and reactive to light.  Cardiovascular:     Rate and Rhythm: Normal rate and regular rhythm.  Pulmonary:     Effort: Pulmonary effort is normal.     Breath sounds: Normal breath sounds.  Abdominal:     General: Bowel sounds are normal.     Palpations: Abdomen is soft.     Tenderness: There is no abdominal tenderness.  Genitourinary:    Labia:        Right: No tenderness or lesion.        Left: No tenderness or lesion.      Vagina: Normal.     Cervix: No cervical motion tenderness, discharge or erythema.     Adnexa: Right adnexa normal and left adnexa normal.  Musculoskeletal:        General: Normal range of motion.     Cervical back: Neck supple.  Skin:    General: Skin is warm and dry.     Comments: Very faint rash to right posterior upper humeral region   Neurological:     Mental Status: She is alert and oriented to person, place, and time.     Cranial Nerves: No cranial nerve deficit.     Deep Tendon Reflexes:     Reflex Scores:      Patellar reflexes are 2+ on the right side and 2+ on the left side. Psychiatric:        Mood and Affect: Mood normal.            Assessment & Plan:

## 2020-01-24 NOTE — Patient Instructions (Signed)
Stop by the lab prior to leaving today. I will notify you of your results once received.   Call the breast center to schedule your mammogram.  Start exercising. You should be getting 150 minutes of moderate intensity exercise weekly.  It's important to improve your diet by reducing consumption of fast food, fried food, processed snack foods, sugary drinks. Increase consumption of fresh vegetables and fruits, whole grains, water.  Ensure you are drinking 64 ounces of water daily.  It was a pleasure to see you today!   Preventive Care 59-33 Years Old, Female Preventive care refers to visits with your health care provider and lifestyle choices that can promote health and wellness. This includes:  A yearly physical exam. This may also be called an annual well check.  Regular dental visits and eye exams.  Immunizations.  Screening for certain conditions.  Healthy lifestyle choices, such as eating a healthy diet, getting regular exercise, not using drugs or products that contain nicotine and tobacco, and limiting alcohol use. What can I expect for my preventive care visit? Physical exam Your health care provider will check your:  Height and weight. This may be used to calculate body mass index (BMI), which tells if you are at a healthy weight.  Heart rate and blood pressure.  Skin for abnormal spots. Counseling Your health care provider may ask you questions about your:  Alcohol, tobacco, and drug use.  Emotional well-being.  Home and relationship well-being.  Sexual activity.  Eating habits.  Work and work Statistician.  Method of birth control.  Menstrual cycle.  Pregnancy history. What immunizations do I need?  Influenza (flu) vaccine  This is recommended every year. Tetanus, diphtheria, and pertussis (Tdap) vaccine  You may need a Td booster every 10 years. Varicella (chickenpox) vaccine  You may need this if you have not been vaccinated. Zoster  (shingles) vaccine  You may need this after age 9. Measles, mumps, and rubella (MMR) vaccine  You may need at least one dose of MMR if you were born in 1957 or later. You may also need a second dose. Pneumococcal conjugate (PCV13) vaccine  You may need this if you have certain conditions and were not previously vaccinated. Pneumococcal polysaccharide (PPSV23) vaccine  You may need one or two doses if you smoke cigarettes or if you have certain conditions. Meningococcal conjugate (MenACWY) vaccine  You may need this if you have certain conditions. Hepatitis A vaccine  You may need this if you have certain conditions or if you travel or work in places where you may be exposed to hepatitis A. Hepatitis B vaccine  You may need this if you have certain conditions or if you travel or work in places where you may be exposed to hepatitis B. Haemophilus influenzae type b (Hib) vaccine  You may need this if you have certain conditions. Human papillomavirus (HPV) vaccine  If recommended by your health care provider, you may need three doses over 6 months. You may receive vaccines as individual doses or as more than one vaccine together in one shot (combination vaccines). Talk with your health care provider about the risks and benefits of combination vaccines. What tests do I need? Blood tests  Lipid and cholesterol levels. These may be checked every 5 years, or more frequently if you are over 42 years old.  Hepatitis C test.  Hepatitis B test. Screening  Lung cancer screening. You may have this screening every year starting at age 47 if you have a  30-pack-year history of smoking and currently smoke or have quit within the past 15 years.  Colorectal cancer screening. All adults should have this screening starting at age 34 and continuing until age 63. Your health care provider may recommend screening at age 1 if you are at increased risk. You will have tests every 1-10 years, depending  on your results and the type of screening test.  Diabetes screening. This is done by checking your blood sugar (glucose) after you have not eaten for a while (fasting). You may have this done every 1-3 years.  Mammogram. This may be done every 1-2 years. Talk with your health care provider about when you should start having regular mammograms. This may depend on whether you have a family history of breast cancer.  BRCA-related cancer screening. This may be done if you have a family history of breast, ovarian, tubal, or peritoneal cancers.  Pelvic exam and Pap test. This may be done every 3 years starting at age 72. Starting at age 65, this may be done every 5 years if you have a Pap test in combination with an HPV test. Other tests  Sexually transmitted disease (STD) testing.  Bone density scan. This is done to screen for osteoporosis. You may have this scan if you are at high risk for osteoporosis. Follow these instructions at home: Eating and drinking  Eat a diet that includes fresh fruits and vegetables, whole grains, lean protein, and low-fat dairy.  Take vitamin and mineral supplements as recommended by your health care provider.  Do not drink alcohol if: ? Your health care provider tells you not to drink. ? You are pregnant, may be pregnant, or are planning to become pregnant.  If you drink alcohol: ? Limit how much you have to 0-1 drink a day. ? Be aware of how much alcohol is in your drink. In the [place] one drink equals one 12 oz bottle of beer (355 mL), one 5 oz glass of wine (148 mL), or one 1 oz glass of hard liquor (44 mL). Lifestyle  Take daily care of your teeth and gums.  Stay active. Exercise for at least 30 minutes on 5 or more days each week.  Do not use any products that contain nicotine or tobacco, such as cigarettes, e-cigarettes, and chewing tobacco. If you need help quitting, ask your health care provider.  If you are sexually active, practice safe sex. Use  a condom or other form of birth control (contraception) in order to prevent pregnancy and STIs (sexually transmitted infections).  If told by your health care provider, take low-dose aspirin daily starting at age 19. What's next?  Visit your health care provider once a year for a well check visit.  Ask your health care provider how often you should have your eyes and teeth checked.  Stay up to date on all vaccines. This information is not intended to replace advice given to you by your health care provider. Make sure you discuss any questions you have with your health care provider. Document Revised: 03/17/2018 Document Reviewed: 03/17/2018 Elsevier Patient Education  2020 [employer].

## 2020-01-24 NOTE — Assessment & Plan Note (Signed)
Tetanus UTD, first shingrix dose provided. Pap smear due, completed today. Mammogram due, orders placed. Colonoscopy UTD per patient, due in 2027. Discussed the importance of a healthy diet and regular exercise in order for weight loss, and to reduce the risk of any potential medical problems.  Exam today benign. Labs pending.

## 2020-01-24 NOTE — Assessment & Plan Note (Signed)
Occurred four days ago, exam today with near resolve. No evidence of cellulitis. Discussed to monitor at home.

## 2020-01-26 LAB — CYTOLOGY - PAP
Comment: NEGATIVE
Diagnosis: NEGATIVE
High risk HPV: NEGATIVE

## 2020-05-30 ENCOUNTER — Ambulatory Visit (INDEPENDENT_AMBULATORY_CARE_PROVIDER_SITE_OTHER): Payer: No Typology Code available for payment source

## 2020-05-30 DIAGNOSIS — Z23 Encounter for immunization: Secondary | ICD-10-CM

## 2020-09-26 ENCOUNTER — Other Ambulatory Visit: Payer: Self-pay

## 2020-09-26 ENCOUNTER — Ambulatory Visit (INDEPENDENT_AMBULATORY_CARE_PROVIDER_SITE_OTHER): Payer: No Typology Code available for payment source | Admitting: Primary Care

## 2020-09-26 ENCOUNTER — Encounter: Payer: Self-pay | Admitting: Primary Care

## 2020-09-26 VITALS — BP 122/84 | HR 88 | Temp 96.1°F | Ht 67.0 in | Wt 188.0 lb

## 2020-09-26 DIAGNOSIS — E782 Mixed hyperlipidemia: Secondary | ICD-10-CM

## 2020-09-26 DIAGNOSIS — E2839 Other primary ovarian failure: Secondary | ICD-10-CM

## 2020-09-26 DIAGNOSIS — R5383 Other fatigue: Secondary | ICD-10-CM | POA: Insufficient documentation

## 2020-09-26 DIAGNOSIS — D709 Neutropenia, unspecified: Secondary | ICD-10-CM

## 2020-09-26 DIAGNOSIS — Z1231 Encounter for screening mammogram for malignant neoplasm of breast: Secondary | ICD-10-CM

## 2020-09-26 DIAGNOSIS — L309 Dermatitis, unspecified: Secondary | ICD-10-CM

## 2020-09-26 HISTORY — DX: Other fatigue: R53.83

## 2020-09-26 LAB — COMPREHENSIVE METABOLIC PANEL
ALT: 23 U/L (ref 0–35)
AST: 16 U/L (ref 0–37)
Albumin: 4.5 g/dL (ref 3.5–5.2)
Alkaline Phosphatase: 41 U/L (ref 39–117)
BUN: 18 mg/dL (ref 6–23)
CO2: 32 mEq/L (ref 19–32)
Calcium: 9.4 mg/dL (ref 8.4–10.5)
Chloride: 101 mEq/L (ref 96–112)
Creatinine, Ser: 0.65 mg/dL (ref 0.40–1.20)
GFR: 96.24 mL/min (ref >60.00)
Glucose, Bld: 86 mg/dL (ref 70–99)
Potassium: 4.2 mEq/L (ref 3.5–5.1)
Sodium: 140 mEq/L (ref 135–145)
Total Bilirubin: 0.6 mg/dL (ref 0.2–1.2)
Total Protein: 6.9 g/dL (ref 6.0–8.3)

## 2020-09-26 LAB — CBC
HCT: 40.1 % (ref 36.0–46.0)
Hemoglobin: 13.9 g/dL (ref 12.0–15.0)
MCHC: 34.6 g/dL (ref 30.0–36.0)
MCV: 89.1 fl (ref 78.0–100.0)
Platelets: 197 10*3/uL (ref 150.0–400.0)
RBC: 4.5 Mil/uL (ref 3.87–5.11)
RDW: 13.1 % (ref 11.5–15.5)
WBC: 3.2 10*3/uL — ABNORMAL LOW (ref 4.0–10.5)

## 2020-09-26 LAB — LIPID PANEL
Cholesterol: 226 mg/dL — ABNORMAL HIGH (ref 0–200)
HDL: 54.9 mg/dL (ref >39.00)
LDL Cholesterol: 134 mg/dL — ABNORMAL HIGH (ref 0–99)
NonHDL: 171.34
Total CHOL/HDL Ratio: 4
Triglycerides: 188 mg/dL — ABNORMAL HIGH (ref 0.0–149.0)
VLDL: 37.6 mg/dL (ref 0.0–40.0)

## 2020-09-26 LAB — VITAMIN D 25 HYDROXY (VIT D DEFICIENCY, FRACTURES): VITD: 28.45 ng/mL — ABNORMAL LOW (ref 30.00–100.00)

## 2020-09-26 LAB — TSH: TSH: 1.46 u[IU]/mL (ref 0.35–4.50)

## 2020-09-26 LAB — VITAMIN B12: Vitamin B-12: 252 pg/mL (ref 211–911)

## 2020-09-26 MED ORDER — TRIAMCINOLONE ACETONIDE 0.1 % EX CREA: 1.0000 "application " | TOPICAL_CREAM | Freq: Two times a day (BID) | CUTANEOUS | 0 refills | Status: DC

## 2020-09-26 NOTE — Progress Notes (Signed)
Subjective:    Patient ID: Brianna Tanner, female    DOB: 16-Feb-1961, 60 y.o.   MRN: [id number]  HPI  Brianna Tanner is a very pleasant 61 y.o. female who presents today for updated labs and form completion.  She forgot her form today, plans on attaching it to her my chart account.   She is also due for mammogram. She is requesting bone density scan. She had a bone density scan in [place] a few years ago which was normal per patient. [employer] will cover.   She has noticed feeling fatigued for the last few months. She is not sleeping well as she has a new puppy ,thinks her fatigue is secondary to this. She would like vitamin levels checked. She denies snoring at night. She is walking a lot at work, active at home, no regular exercise.   Colonoscopy completed in 2017, due in 2027.  She is also requesting a refill of her triamcinolone cream for chronic and intermittent eczema which typically occurs to the posterior neck.   BP Readings from Last 3 Encounters:  09/26/20 122/84  01/24/20 128/82  04/06/19 128/86         Review of Systems  Constitutional: Positive for fatigue.  Respiratory: Negative for shortness of breath.   Cardiovascular: Negative for chest pain.  Skin: Positive for rash.  Neurological: Negative for dizziness and headaches.         Past Medical History:  Diagnosis Date  . Chickenpox   . History of kidney stones   . Hyperlipidemia   . Influenza A 10/05/2018    Social History   Socioeconomic History  . Marital status: Married    Spouse name: Not on file  . Number of children: Not on file  . Years of education: Not on file  . Highest education level: Not on file  Occupational History  . Not on file  Tobacco Use  . Smoking status: Never Smoker  . Smokeless tobacco: Never Used  Substance and Sexual Activity  . Alcohol use: Yes  . Drug use: Not on file  . Sexual activity: Not on file  Other Topics Concern  . Not  on file  Social History Narrative   Married.   3 children.   Moved from [place].   Works at [employer]   Enjoys antiquing, hiking.    Social Determinants of Health   Financial Resource Strain: Not on file  Food Insecurity: Not on file  Transportation Needs: Not on file  Physical Activity: Not on file  Stress: Not on file  Social Connections: Not on file  Intimate Partner Violence: Not on file    Past Surgical History:  Procedure Laterality Date  . ABLATION  2016  . SKIN CANCER EXCISION  08/2015    Family History  Problem Relation Age of Onset  . Uterine cancer Mother   . Arthritis Father   . Stroke Father   . Hypertension Father   . Hyperlipidemia Brother   . Stroke Brother     No Known Allergies  Current Outpatient Medications on File Prior to Visit  Medication Sig Dispense Refill  . mometasone (NASONEX) 50 MCG/ACT nasal spray Place 1 spray into the nose 2 (two) times daily as needed. 17 g 0   No current facility-administered medications on file prior to visit.    There were no vitals taken for this visit. Objective:   Physical Exam Cardiovascular:     Rate and Rhythm: Normal rate and regular  rhythm.  Pulmonary:     Effort: Pulmonary effort is normal.     Breath sounds: Normal breath sounds.  Musculoskeletal:     Cervical back: Neck supple.  Skin:    General: Skin is warm and dry.     Comments: Red patch to base of right occipital head.            Assessment & Plan:      This visit occurred during the SARS-CoV-2 public health emergency.  Safety protocols were in place, including screening questions prior to the visit, additional usage of staff PPE, and extensive cleaning of exam room while observing appropriate contact time as indicated for disinfecting solutions.

## 2020-09-26 NOTE — Assessment & Plan Note (Addendum)
Chronic, uses triamcinolone 0.1% cream infrequently, is requesting a refill today. Refill provided.

## 2020-09-26 NOTE — Patient Instructions (Signed)
Stop by the lab prior to leaving today. I will notify you of your results once received.   Use the triamcinolone cream twice daily as needed.  It was a pleasure to see you today!

## 2020-09-26 NOTE — Addendum Note (Signed)
Addended by: Cloyd Stagers on: 09/26/2020 04:24 PM   Modules accepted: Orders

## 2020-09-26 NOTE — Assessment & Plan Note (Signed)
Discussed the importance of a healthy diet and regular exercise in order for weight loss, and to reduce the risk of any potential medical problems.  Lipid panel pending today.

## 2020-09-26 NOTE — Assessment & Plan Note (Signed)
Acute for the last few months.  Could be secondary to inconsistent sleep with her new puppy.   Checking labs today including TSH, CBC, Vitamin D and B12.   Encouraged regular exercise, healthy diet.

## 2020-09-27 LAB — PATHOLOGIST SMEAR REVIEW

## 2020-09-30 NOTE — Telephone Encounter (Signed)
Please advise 

## 2020-11-22 ENCOUNTER — Telehealth: Payer: Self-pay

## 2020-11-22 NOTE — Telephone Encounter (Signed)
Noted. Agree with Dispo 

## 2020-11-22 NOTE — Telephone Encounter (Signed)
[facility] - Client TELEPHONE ADVICE RECORD [facility] Patient Name: Brianna Tanner Gender: Female DOB: 02/14/1951 Age: 60 Y 78 M 34 D Return Phone Number: [phone] (Primary), [phone] (Secondary) Address: City/ State/ Zip: [place] [address] Client [facility] - Client Client Site [facility] - Day Physician Alma Friendly - NP Contact Type Call Who Is Calling Patient / Member / Family / Caregiver Call Type Triage / Clinical Relationship To Patient Self Return Phone Number [phone] (Primary) Chief Complaint Headache Reason for Call Symptomatic / Request for Health Information Initial Comment Caller is Threasa Beards from [facility] with a pt who is experiencing fatigue and a headache. Caller states there are no appts til next week. Translation No Nurse Assessment Nurse: Hardin Negus, RN, Mardene Celeste Date/Time Eilene Ghazi Time): 11/22/2020 2:04:55 PM Confirm and document reason for call. If symptomatic, describe symptoms. ---She is experiencing fatigue and a headache . She had blood down last month. The headache just started today. denies any other S&S. Does the patient have any new or worsening symptoms? ---Yes Will a triage be completed? ---Yes Related visit to physician within the last 2 weeks? ---No Does the PT have any chronic conditions? (i.e. diabetes, asthma, this includes High risk factors for pregnancy, etc.) ---No Is this a behavioral health or substance abuse call? ---No Guidelines Guideline Title Affirmed Question Affirmed Notes Nurse Date/Time (Eastern Time) Headache [1] SEVERE headache AND [2] sudden-onset (i.e., reaching maximum intensity within seconds to 1 hour) Hardin Negus, RN, Mardene Celeste 11/22/2020 2:07:37 PM Disp. Time Eilene Ghazi Time) Disposition Final User 11/22/2020 1:55:45 PM Attempt made - message left Lahoma Crocker 11/22/2020 2:00:56 PM Send To  RN Personal Raenette Rover, RN, Zella Ball PLEASE NOTE: All timestamps contained within this report are represented as [place] Standard Time. CONFIDENTIALTY NOTICE: This fax transmission is intended only for the addressee. It contains information that is legally privileged, confidential or otherwise protected from use or disclosure. If you are not the intended recipient, you are strictly prohibited from reviewing, disclosing, copying using or disseminating any of this information or taking any action in reliance on or regarding this information. If you have received this fax in error, please notify us immediately by telephone so that we can arrange for its return to [name]. Phone: [phone], Toll-Free: [phone], Fax: [phone] Page: 2 of 2 Call Id: [id number] 11/22/2020 2:09:52 PM Go to ED Now (or PCP triage) Yes Hardin Negus, RN, Lenox Ponds Disagree/Comply Comply Caller Understands Yes PreDisposition Call Doctor Care Advice Given Per Guideline GO TO ED NOW (OR PCP TRIAGE): ANOTHER ADULT SHOULD DRIVE: CARE ADVICE given per Headache (Adult) guideline. * It is better and safer if another adult drives instead of you. Referrals GO TO FACILITY UNDECIDED

## 2020-11-22 NOTE — Telephone Encounter (Signed)
I spoke with pt and she said she does not feel bad enough to go to ED or UC. Pt said she will wait and see how she feels next wk. UC & ED precautions given and pt voiced understanding. I explained that I was not saying pt has covid but has potential symptom of covid and pt said she would think about it and decide if she was going to UC or not. Sending note to Gentry Fitz NP and [facility] CMA.

## 2020-11-28 ENCOUNTER — Ambulatory Visit: Payer: No Typology Code available for payment source | Admitting: Primary Care

## 2020-11-28 ENCOUNTER — Other Ambulatory Visit: Payer: Self-pay

## 2020-11-28 ENCOUNTER — Encounter: Payer: Self-pay | Admitting: Primary Care

## 2020-11-28 VITALS — BP 126/70 | HR 77 | Temp 97.7°F | Ht 67.0 in | Wt 194.0 lb

## 2020-11-28 DIAGNOSIS — D72819 Decreased white blood cell count, unspecified: Secondary | ICD-10-CM | POA: Insufficient documentation

## 2020-11-28 DIAGNOSIS — R233 Spontaneous ecchymoses: Secondary | ICD-10-CM

## 2020-11-28 DIAGNOSIS — R5383 Other fatigue: Secondary | ICD-10-CM

## 2020-11-28 DIAGNOSIS — R238 Other skin changes: Secondary | ICD-10-CM | POA: Diagnosis not present

## 2020-11-28 HISTORY — DX: Spontaneous ecchymoses: R23.3

## 2020-11-28 NOTE — Progress Notes (Signed)
Subjective:    Patient ID: Brianna Tanner, female    DOB: 07-24-1960, 60 y.o.   MRN: [id number]  HPI  Brianna Tanner is a very pleasant 60 y.o. female with a history of fatigue, hyperlipidemia who presents today to discuss several symptoms.   Today she endorses feeling fatigued, headaches, hair loss, flushed appearing, feeling forgetful, bruising without trauma. Symptoms began about one month ago.   Labs in March 2022 completed which showed normal TSH, low vitamin D, lower normal vitamin B12, leukopenia with normal pathology smear.   She is not taking vitamin D or B12 supplements. She denies anxiety, depression, difficulty sleeping, changes in her diet. She does not exercise, does walk 10,000-25,000 give days weekly at work.   Her bruising has been noted to her hands, upper and lower extremities, trunk. She does not recall injury.    Review of Systems  Constitutional: Positive for fatigue.  Skin:       Hair loss  Neurological: Positive for headaches.  Hematological: Bruises/bleeds easily.  Psychiatric/Behavioral: The patient is not nervous/anxious.          Past Medical History:  Diagnosis Date  . Chickenpox   . History of kidney stones   . Hyperlipidemia   . Influenza A 10/05/2018    Social History   Socioeconomic History  . Marital status: Married    Spouse name: Not on file  . Number of children: Not on file  . Years of education: Not on file  . Highest education level: Not on file  Occupational History  . Not on file  Tobacco Use  . Smoking status: Never Smoker  . Smokeless tobacco: Never Used  Substance and Sexual Activity  . Alcohol use: Yes  . Drug use: Not on file  . Sexual activity: Not on file  Other Topics Concern  . Not on file  Social History Narrative   Married.   3 children.   Moved from [place].   Works at [employer]   Enjoys antiquing, hiking.    Social Determinants of Health   Financial Resource Strain: Not on  file  Food Insecurity: Not on file  Transportation Needs: Not on file  Physical Activity: Not on file  Stress: Not on file  Social Connections: Not on file  Intimate Partner Violence: Not on file    Past Surgical History:  Procedure Laterality Date  . ABLATION  2016  . SKIN CANCER EXCISION  08/2015    Family History  Problem Relation Age of Onset  . Uterine cancer Mother   . Arthritis Father   . Stroke Father   . Hypertension Father   . Hyperlipidemia Brother   . Stroke Brother     No Known Allergies  Current Outpatient Medications on File Prior to Visit  Medication Sig Dispense Refill  . fluticasone (FLONASE) 50 MCG/ACT nasal spray Place into the nose as needed.    . mometasone (NASONEX) 50 MCG/ACT nasal spray Place 1 spray into the nose 2 (two) times daily as needed. 17 g 0  . triamcinolone (KENALOG) 0.1 % Apply 1 application topically 2 (two) times daily. 30 g 0   No current facility-administered medications on file prior to visit.    BP 126/70   Pulse 77   Temp 97.7 F (36.5 C) (Temporal)   Ht 5\' 7"  (1.702 m)   Wt 194 lb (88 kg)   SpO2 98%   BMI 30.38 kg/m  Objective:   Physical Exam Cardiovascular:  Rate and Rhythm: Normal rate and regular rhythm.  Pulmonary:     Effort: Pulmonary effort is normal.     Breath sounds: Normal breath sounds.  Musculoskeletal:     Cervical back: Neck supple.  Skin:    General: Skin is warm and dry.     Findings: Bruising present.     Comments: Minimal scattered bruising to upper and lower extremities, left lower abdomen in various stages of healing.            Assessment & Plan:      This visit occurred during the SARS-CoV-2 public health emergency.  Safety protocols were in place, including screening questions prior to the visit, additional usage of staff PPE, and extensive cleaning of exam room while observing appropriate contact time as indicated for disinfecting solutions.

## 2020-11-28 NOTE — Patient Instructions (Signed)
Stop by the lab prior to leaving today. I will notify you of your results once received.   You will be contacted regarding your referral to hematology.  Please let us know if you have not been contacted within two weeks.   Start vitamin D3 daily as discussed. Start vitamin B12 1000 mcg daily.  It was a pleasure to see you today!

## 2020-11-28 NOTE — Assessment & Plan Note (Signed)
Ongoing, labs from last visit show leukopenia with normal smear, low vitamin D, lower normal vitamin B12, normal TSH.  Discussed to start vitamin D3 and vitamin B12. Will repeat CBC and pathology smear today. She doesn't appear to be anxious or stressed and is active during her work day.  Will refer to hematology for further evaluation given CBC results, bruising, and other symptoms.

## 2020-11-28 NOTE — Assessment & Plan Note (Signed)
Noted from prior labs and recent labs. Normal pathology smear.  Repeat CBC and pathology smear. Referral placed to hematology

## 2020-11-28 NOTE — Assessment & Plan Note (Signed)
Acute for the last month. Repeat CBC, add pathology smear. Referral placed to hematology

## 2020-11-29 LAB — CBC WITH DIFFERENTIAL/PLATELET
Absolute Monocytes: 353 cells/uL (ref 200–950)
Basophils Absolute: 22 cells/uL (ref 0–200)
Basophils Relative: 0.5 %
Eosinophils Absolute: 90 cells/uL (ref 15–500)
Eosinophils Relative: 2.1 %
HCT: 42 % (ref 35.0–45.0)
Hemoglobin: 14.1 g/dL (ref 11.7–15.5)
Lymphs Abs: 1355 cells/uL (ref 850–3900)
MCH: 30.1 pg (ref 27.0–33.0)
MCHC: 33.6 g/dL (ref 32.0–36.0)
MCV: 89.7 fL (ref 80.0–100.0)
MPV: 11.5 fL (ref 7.5–12.5)
Monocytes Relative: 8.2 %
Neutro Abs: 2481 cells/uL (ref 1500–7800)
Neutrophils Relative %: 57.7 %
Platelets: 227 10*3/uL (ref 140–400)
RBC: 4.68 10*6/uL (ref 3.80–5.10)
RDW: 12.8 % (ref 11.0–15.0)
Total Lymphocyte: 31.5 %
WBC: 4.3 10*3/uL (ref 3.8–10.8)

## 2020-11-29 LAB — PATHOLOGIST SMEAR REVIEW

## 2020-12-20 ENCOUNTER — Inpatient Hospital Stay: Payer: No Typology Code available for payment source

## 2020-12-20 ENCOUNTER — Encounter: Payer: Self-pay | Admitting: Oncology

## 2020-12-20 ENCOUNTER — Inpatient Hospital Stay: Payer: No Typology Code available for payment source | Attending: Oncology | Admitting: Oncology

## 2020-12-20 VITALS — BP 136/74 | HR 67 | Temp 99.1°F | Resp 18 | Ht 67.0 in | Wt 192.9 lb

## 2020-12-20 DIAGNOSIS — D72819 Decreased white blood cell count, unspecified: Secondary | ICD-10-CM | POA: Insufficient documentation

## 2020-12-20 DIAGNOSIS — R238 Other skin changes: Secondary | ICD-10-CM | POA: Diagnosis not present

## 2020-12-20 DIAGNOSIS — R233 Spontaneous ecchymoses: Secondary | ICD-10-CM

## 2020-12-20 LAB — CBC WITH DIFFERENTIAL/PLATELET
Abs Immature Granulocytes: 0.01 10*3/uL (ref 0.00–0.07)
Basophils Absolute: 0 10*3/uL (ref 0.0–0.1)
Basophils Relative: 0 %
Eosinophils Absolute: 0.1 10*3/uL (ref 0.0–0.5)
Eosinophils Relative: 2 %
HCT: 41.2 % (ref 36.0–46.0)
Hemoglobin: 14.6 g/dL (ref 12.0–15.0)
Immature Granulocytes: 0 %
Lymphocytes Relative: 25 %
Lymphs Abs: 1.2 10*3/uL (ref 0.7–4.0)
MCH: 31.2 pg (ref 26.0–34.0)
MCHC: 35.4 g/dL (ref 30.0–36.0)
MCV: 88 fL (ref 80.0–100.0)
Monocytes Absolute: 0.3 10*3/uL (ref 0.1–1.0)
Monocytes Relative: 7 %
Neutro Abs: 3.2 10*3/uL (ref 1.7–7.7)
Neutrophils Relative %: 66 %
Platelets: 217 10*3/uL (ref 150–400)
RBC: 4.68 MIL/uL (ref 3.87–5.11)
RDW: 11.9 % (ref 11.5–15.5)
WBC: 4.7 10*3/uL (ref 4.0–10.5)
nRBC: 0 % (ref 0.0–0.2)

## 2020-12-20 LAB — PROTIME-INR
INR: 0.9 (ref 0.8–1.2)
Prothrombin Time: 12.1 seconds (ref 11.4–15.2)

## 2020-12-20 LAB — TECHNOLOGIST SMEAR REVIEW

## 2020-12-20 LAB — VITAMIN B12: Vitamin B-12: 1136 pg/mL — ABNORMAL HIGH (ref 180–914)

## 2020-12-20 LAB — FOLATE: Folate: 11.7 ng/mL (ref >5.9)

## 2020-12-20 LAB — APTT: aPTT: 20 seconds — ABNORMAL LOW (ref 24–36)

## 2020-12-20 NOTE — Progress Notes (Signed)
Pt here to establish care for leukopenia.

## 2020-12-21 NOTE — Progress Notes (Signed)
Hematology/Oncology Consult note [facility] Telephone:([phone] [phone]   Patient Care Team: Pleas Koch, NP as PCP - General (Internal Medicine)  REFERRING PROVIDER: Pleas Koch, NP  CHIEF COMPLAINTS/REASON FOR VISIT:  Evaluation of leukopenia and easy bruising  HISTORY OF PRESENTING ILLNESS:  Brianna Tanner is a 60 y.o. female who was seen in consultation at the request of Pleas Koch, NP for evaluation of leukopenia. Reviewed patient's recent labs.  11/28/2020, WBC 4.3, normal differential. Previous lab records in EMR was reviewed. 01/24/2020, WBC 3.8, 09/26/2020 WBC 3.2, no differential was done  No aggravating or improving factors.  Associated symptoms:  denies fatigue, weight loss, fever, chills, frequent infection.  History hepatitis or HIV infection: Denies History of chronic liver disease: Denies History of blood transfusion: Denies Alcohol consumption: Denies Diet Vegetarian or Vegan: Denies  Patient also reported easy bruising, patient's husband showed me a few pictures of bruising on her hands, and the lower extremities.  No bruising on her trunks. No acute bleeding events.  Review of Systems  Constitutional: Negative for appetite change, chills, fatigue and fever.  HENT:   Negative for hearing loss and voice change.   Eyes: Negative for eye problems.  Respiratory: Negative for chest tightness and cough.   Cardiovascular: Negative for chest pain.  Gastrointestinal: Negative for abdominal distention, abdominal pain and blood in stool.  Endocrine: Negative for hot flashes.  Genitourinary: Negative for difficulty urinating and frequency.   Musculoskeletal: Negative for arthralgias.  Skin: Negative for itching and rash.  Neurological: Negative for extremity weakness.  Hematological: Negative for adenopathy. Bruises/bleeds easily.  Psychiatric/Behavioral: Negative for confusion.    MEDICAL  HISTORY:  Past Medical History:  Diagnosis Date  . Chickenpox   . History of kidney stones   . Hyperlipidemia   . Influenza A 10/05/2018    SURGICAL HISTORY: Past Surgical History:  Procedure Laterality Date  . ABLATION  2016  . SKIN CANCER EXCISION  08/2015  . TUBAL LIGATION      SOCIAL HISTORY: Social History   Socioeconomic History  . Marital status: Married    Spouse name: Not on file  . Number of children: Not on file  . Years of education: Not on file  . Highest education level: Not on file  Occupational History  . Not on file  Tobacco Use  . Smoking status: Never Smoker  . Smokeless tobacco: Never Used  Vaping Use  . Vaping Use: Never used  Substance and Sexual Activity  . Alcohol use: Yes  . Drug use: Never  . Sexual activity: Not on file  Other Topics Concern  . Not on file  Social History Narrative   Married.   3 children.   Moved from [place].   Works at [employer]   Enjoys antiquing, hiking.    Social Determinants of Health   Financial Resource Strain: Not on file  Food Insecurity: Not on file  Transportation Needs: Not on file  Physical Activity: Not on file  Stress: Not on file  Social Connections: Not on file  Intimate Partner Violence: Not on file    FAMILY HISTORY: Family History  Problem Relation Age of Onset  . Uterine cancer Mother   . Arthritis Father   . Stroke Father   . Hypertension Father   . Hyperlipidemia Brother   . Stroke Brother   . Colon cancer Maternal Grandmother   . Cancer Maternal Grandfather        bone marrow  cancer     ALLERGIES:  has No Known Allergies.  MEDICATIONS:  Current Outpatient Medications  Medication Sig Dispense Refill  . fluticasone (FLONASE) 50 MCG/ACT nasal spray Place into the nose as needed.    . mometasone (NASONEX) 50 MCG/ACT nasal spray Place 1 spray into the nose 2 (two) times daily as needed. 17 g 0  . triamcinolone (KENALOG) 0.1 % Apply 1 application topically 2 (two) times  daily. (Patient not taking: Reported on 12/20/2020) 30 g 0   No current facility-administered medications for this visit.     PHYSICAL EXAMINATION: ECOG PERFORMANCE STATUS: 1 - Symptomatic but completely ambulatory Vitals:   12/20/20 1145  BP: 136/74  Pulse: 67  Resp: 18  Temp: 99.1 F (37.3 C)   Filed Weights   12/20/20 1145  Weight: 192 lb 14.4 oz (87.5 kg)    Physical Exam Constitutional:      General: She is not in acute distress. HENT:     Head: Normocephalic and atraumatic.  Eyes:     General: No scleral icterus. Cardiovascular:     Rate and Rhythm: Normal rate and regular rhythm.     Heart sounds: Normal heart sounds.  Pulmonary:     Effort: Pulmonary effort is normal. No respiratory distress.     Breath sounds: No wheezing.  Abdominal:     General: Bowel sounds are normal. There is no distension.     Palpations: Abdomen is soft.  Musculoskeletal:        General: No deformity. Normal range of motion.     Cervical back: Normal range of motion and neck supple.  Skin:    General: Skin is warm and dry.     Findings: No erythema or rash.     Comments: No bruising, petechia on current examination.  Neurological:     Mental Status: She is alert and oriented to person, place, and time. Mental status is at baseline.     Cranial Nerves: No cranial nerve deficit.     Coordination: Coordination normal.  Psychiatric:        Mood and Affect: Mood normal.     RADIOGRAPHIC STUDIES: I have personally reviewed the radiological images as listed and agreed with the findings in the report.  CMP Latest Ref Rng & Units 09/26/2020  Glucose 70 - 99 mg/dL 86  BUN 6 - 23 mg/dL 18  Creatinine 0.40 - 1.20 mg/dL 0.65  Sodium 135 - 145 mEq/L 140  Potassium 3.5 - 5.1 mEq/L 4.2  Chloride 96 - 112 mEq/L 101  CO2 19 - 32 mEq/L 32  Calcium 8.4 - 10.5 mg/dL 9.4  Total Protein 6.0 - 8.3 g/dL 6.9  Total Bilirubin 0.2 - 1.2 mg/dL 0.6  Alkaline Phos 39 - 117 U/L 41  AST 0 - 37 U/L 16   ALT 0 - 35 U/L 23   CBC Latest Ref Rng & Units 12/20/2020  WBC 4.0 - 10.5 K/uL 4.7  Hemoglobin 12.0 - 15.0 g/dL 14.6  Hematocrit 36.0 - 46.0 % 41.2  Platelets 150 - 400 K/uL 217    LABORATORY DATA:  I have reviewed the data as listed Lab Results  Component Value Date   WBC 4.7 12/20/2020   HGB 14.6 12/20/2020   HCT 41.2 12/20/2020   MCV 88.0 12/20/2020   PLT 217 12/20/2020   Recent Labs    01/24/20 1258 09/26/20 1115  NA 140 140  K 4.0 4.2  CL 103 101  CO2 28 32  GLUCOSE 92  86  BUN 17 18  CREATININE 0.61 0.65  CALCIUM 9.4 9.4  PROT 6.9 6.9  ALBUMIN 4.9 4.5  AST 12 16  ALT 12 23  ALKPHOS 42 41  BILITOT 0.5 0.6   Iron/TIBC/Ferritin/ %Sat No results found for: IRON, TIBC, FERRITIN, IRONPCTSAT   RADIOGRAPHIC STUDIES: I have personally reviewed the radiological images as listed and agreed with the findings in the report. No results found.    ASSESSMENT & PLAN:  1. Easy bruising   2. Leukopenia, unspecified type     I discussed with patient that the differential diagnosis of the leukopenia is broad, including infection, inflammation, nutrition deficiency, malignant etiology including underlying bone morrow disorders.  Most recent CBC showed normalized total white count, possible reactive. Recommend patient to have CBC repeated, check B12, folate, smear.  I will hold off additional testing if these tests are completely normal.  Easy bruising, predominantly on her extremities.  Reassurance was given that bruising on extremities are less concerning.  Check PT and PTT.  Check vitamin C level.    Orders Placed This Encounter  Procedures  . CBC with Differential/Platelet    Standing Status:   Future    Number of Occurrences:   1    Standing Expiration Date:   12/20/2021  . Technologist smear review    Standing Status:   Future    Number of Occurrences:   1    Standing Expiration Date:   12/20/2021  . Vitamin B12    Standing Status:   Future    Number of  Occurrences:   1    Standing Expiration Date:   12/20/2021  . Folate    Standing Status:   Future    Number of Occurrences:   1    Standing Expiration Date:   12/20/2021  . Protime-INR    Standing Status:   Future    Number of Occurrences:   1    Standing Expiration Date:   12/20/2021  . APTT    Standing Status:   Future    Number of Occurrences:   1    Standing Expiration Date:   12/20/2021    All questions were answered. The patient knows to call the clinic with any problems questions or concerns.  Cc Pleas Koch, NP  Return of visit: To be determined based on blood work.       Earlie Server, MD, PhD 12/21/2020

## 2020-12-27 LAB — VITAMIN C: Vitamin C: 1.3 mg/dL (ref 0.4–2.0)

## 2021-01-01 ENCOUNTER — Telehealth: Payer: Self-pay

## 2021-01-01 DIAGNOSIS — R233 Spontaneous ecchymoses: Secondary | ICD-10-CM

## 2021-01-01 NOTE — Telephone Encounter (Signed)
-----   Message from Earlie Server, MD sent at 12/31/2020 11:57 PM EDT ----- Let her know that work up did not reveal any significant findings to explain her easy bruising. AS we discussed, bruising on extremities are less concerning. Recommend observation of her symptoms. Slightly decreased aPTT which is non specific, probably lab artifact. Please arrange her to repeat aPTT. Thanks.

## 2021-01-01 NOTE — Telephone Encounter (Signed)
Unable to reach pt by phone so sent her a Mychart message letting her know of results and asking when she would be able to do repeat lab.

## 2021-01-02 NOTE — Telephone Encounter (Signed)
Pt coming for labs on 6/20.

## 2021-01-02 NOTE — Telephone Encounter (Signed)
Please contact pt to schedule lab appt per requested date, but if she can do a sooner date that would be best. Thank you

## 2021-01-02 NOTE — Telephone Encounter (Signed)
Patient scheduled for labs on 6/20--spoke with her to confirm. Thanks!

## 2021-01-06 ENCOUNTER — Inpatient Hospital Stay: Payer: No Typology Code available for payment source

## 2021-01-06 ENCOUNTER — Other Ambulatory Visit: Payer: Self-pay

## 2021-01-06 DIAGNOSIS — R233 Spontaneous ecchymoses: Secondary | ICD-10-CM

## 2021-01-06 DIAGNOSIS — D72819 Decreased white blood cell count, unspecified: Secondary | ICD-10-CM | POA: Diagnosis not present

## 2021-01-06 LAB — APTT: aPTT: 29 seconds (ref 24–36)

## 2021-03-12 ENCOUNTER — Ambulatory Visit (INDEPENDENT_AMBULATORY_CARE_PROVIDER_SITE_OTHER): Payer: No Typology Code available for payment source | Admitting: Family Medicine

## 2021-03-12 ENCOUNTER — Encounter: Payer: Self-pay | Admitting: Family Medicine

## 2021-03-12 ENCOUNTER — Other Ambulatory Visit: Payer: Self-pay

## 2021-03-12 VITALS — BP 130/80 | HR 88 | Temp 98.3°F | Ht 67.0 in | Wt 181.2 lb

## 2021-03-12 DIAGNOSIS — M6281 Muscle weakness (generalized): Secondary | ICD-10-CM | POA: Diagnosis not present

## 2021-03-12 DIAGNOSIS — R5383 Other fatigue: Secondary | ICD-10-CM

## 2021-03-12 DIAGNOSIS — M791 Myalgia, unspecified site: Secondary | ICD-10-CM

## 2021-03-12 DIAGNOSIS — M2550 Pain in unspecified joint: Secondary | ICD-10-CM | POA: Diagnosis not present

## 2021-03-12 DIAGNOSIS — R531 Weakness: Secondary | ICD-10-CM

## 2021-03-12 DIAGNOSIS — R269 Unspecified abnormalities of gait and mobility: Secondary | ICD-10-CM

## 2021-03-12 LAB — T3, FREE: T3, Free: 3 pg/mL (ref 2.3–4.2)

## 2021-03-12 LAB — HIGH SENSITIVITY CRP: CRP, High Sensitivity: 36.95 mg/L — ABNORMAL HIGH (ref 0.000–5.000)

## 2021-03-12 LAB — TSH: TSH: 1.39 u[IU]/mL (ref 0.35–5.50)

## 2021-03-12 LAB — CK: Total CK: 65 U/L (ref 7–177)

## 2021-03-12 LAB — SEDIMENTATION RATE: Sed Rate: 61 mm/hr — ABNORMAL HIGH (ref 0–30)

## 2021-03-12 LAB — T4, FREE: Free T4: 0.91 ng/dL (ref 0.60–1.60)

## 2021-03-12 NOTE — Progress Notes (Signed)
Brianna Milles T. Piers Baade, MD, [place] at [facility] [place], [address]  Phone: [phone]  FAX: [place] - 60 y.o. female  MRN [id number]  Date of Birth: 09/15/1960  Date: 03/12/2021  PCP: Pleas Koch, NP  Referral: Pleas Koch, NP  Chief Complaint  Patient presents with   Muscle Pain    Started in June   Joint Pain    Knee/Hip/Shoulder-Seen by Basilio [facility] and [facility].  They did x-rays and MRI-Suppose to start PT on Friday    This visit occurred during the SARS-CoV-2 public health emergency.  Safety protocols were in place, including screening questions prior to the visit, additional usage of staff PPE, and extensive cleaning of exam room while observing appropriate contact time as indicated for disinfecting solutions.   Subjective:   Brianna Tanner is a 60 y.o. very pleasant female patient with Body mass index is 28.39 kg/m. who presents with the following:  She has diffuse muscle pain and polyarthralgia.  She has been seen by multiple different subspecialists including multiple orthopedic surgeons as well as chiropractor.  They have done x-rays as well as multiple MRIs without significant findings.  She is post to start physical therapy on Friday.  She does not have any known rheumatological condition, no ulcerative colitis, no Crohn's disease and no psoriasis.  All of the bodies with all muscles - not focal to a specific joint.  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 Much different in the last year. Did have endless energy.  Within the last 2 months this is markedly decreased.    Review of Systems is noted in the HPI, as appropriate  Objective:   BP 130/80   Pulse 88   Temp 98.3 F (36.8 C) (Temporal)   Ht '5\' 7"'$  (1.702 m)   Wt 181 lb 4 oz  (82.2 kg)   SpO2 96%   BMI 28.39 kg/m   GEN: No acute distress; alert,appropriate. PULM: Breathing comfortably in no respiratory distress PSYCH: Normally interactive.   Full range of motion at both shoulders, elbows, wrist as well as all digits.  Grossly strength is 5/5 throughout Full range of motion at the knees as well as the foot and ankles and hips. Strength is grossly 5/5.  I do not appreciate any significant synovitis. She is also neurovascularly intact.   Laboratory and Imaging Data: Results for orders placed or performed in visit on 03/12/21  T4, free  Result Value Ref Range   Free T4 0.91 0.60 - 1.60 ng/dL  T3, free  Result Value Ref Range   T3, Free 3.0 2.3 - 4.2 pg/mL  TSH  Result Value Ref Range   TSH 1.39 0.35 - 5.50 uIU/mL  High sensitivity CRP  Result Value Ref Range   CRP, High Sensitivity 36.950 (H) 0.000 - 5.000 mg/L  Sedimentation rate  Result Value Ref Range   Sed Rate 61 (H) 0 - 30 mm/hr  CK  Result Value Ref Range   Total CK 65 7 - 177 U/L  Lactate Dehydrogenase  Result Value Ref Range   LDH 180 120 - 250 U/L     Assessment and Plan:     ICD-10-CM   1. Myalgia  M79.10 T4, free    T3, free    TSH  High sensitivity CRP    Sedimentation rate    ANA Screen,IFA,Reflex Titer/Pattern,Reflex Mplx 11 Ab Cascade with IdentRA    CK    Lactate Dehydrogenase    2. Polyarthralgia  M25.50 T4, free    T3, free    TSH    High sensitivity CRP    Sedimentation rate    ANA Screen,IFA,Reflex Titer/Pattern,Reflex Mplx 11 Ab Cascade with IdentRA    CK    Lactate Dehydrogenase    3. Weakness  R53.1 T4, free    T3, free    TSH    High sensitivity CRP    Sedimentation rate    ANA Screen,IFA,Reflex Titer/Pattern,Reflex Mplx 11 Ab Cascade with IdentRA    CK    Lactate Dehydrogenase    4. Abnormal gait due to muscle weakness  M62.81 T4, free   R26.9 T3, free    TSH    High sensitivity CRP    Sedimentation rate    ANA Screen,IFA,Reflex  Titer/Pattern,Reflex Mplx 11 Ab Cascade with IdentRA    CK    Lactate Dehydrogenase    5. Other fatigue  R53.83 T4, free    T3, free    TSH    High sensitivity CRP    Sedimentation rate    ANA Screen,IFA,Reflex Titer/Pattern,Reflex Mplx 11 Ab Cascade with IdentRA    CK    Lactate Dehydrogenase     New onset diffuse severe arthralgia as well as myalgia.  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  Additional rheumatological work-up and panel is also pending.  Pathology such as RA certainly is possible as well.  If appears to be polymyalgia rheumatica, start with prednisone 50 mg daily.  Social: Her ability to do her basic activities of daily living is limited, let alone exercise.  No orders of the defined types were placed in this encounter.  Medications Discontinued During This Encounter  Medication Reason   fluticasone (FLONASE) 50 MCG/ACT nasal spray Completed Course   mometasone (NASONEX) 50 MCG/ACT nasal spray Completed Course   triamcinolone (KENALOG) 0.1 % Completed Course   Orders Placed This Encounter  Procedures   T4, free   T3, free   TSH   High sensitivity CRP   Sedimentation rate   ANA Screen,IFA,Reflex Titer/Pattern,Reflex Mplx 11 Ab Cascade with IdentRA   CK   Lactate Dehydrogenase    Follow-up: No follow-ups on file.  [employer] One speech-to-text software was used for transcription in this dictation.  Possible transcriptional errors can occur using Editor, commissioning.   Signed,  Maud Deed. Shayley Medlin, MD   Outpatient Encounter Medications as of 03/12/2021  Medication Sig   acetaminophen (TYLENOL) 650 MG CR tablet Take 650-1,300 mg by mouth every 8 (eight) hours as needed for pain.   cyclobenzaprine (FLEXERIL) 5 MG tablet Take 5 mg by mouth 2 (two) times daily as needed.   diclofenac (VOLTAREN) 75 MG EC tablet Take 75 mg by mouth 2 (two) times daily.   [DISCONTINUED] fluticasone (FLONASE) 50 MCG/ACT nasal  spray Place into the nose as needed.   [DISCONTINUED] mometasone (NASONEX) 50 MCG/ACT nasal spray Place 1 spray into the nose 2 (two) times daily as needed.   [DISCONTINUED] triamcinolone (KENALOG) 0.1 % Apply 1 application topically 2 (two) times daily. (Patient not taking: Reported on 12/20/2020)   No facility-administered encounter medications on file as of 03/12/2021.

## 2021-03-13 ENCOUNTER — Ambulatory Visit
Admission: RE | Admit: 2021-03-13 | Discharge: 2021-03-13 | Disposition: A | Discharge status: Home / Self Care | Payer: No Typology Code available for payment source | Source: Ambulatory Visit | Attending: Primary Care | Admitting: Primary Care

## 2021-03-13 DIAGNOSIS — E2839 Other primary ovarian failure: Secondary | ICD-10-CM | POA: Insufficient documentation

## 2021-03-13 DIAGNOSIS — Z1231 Encounter for screening mammogram for malignant neoplasm of breast: Secondary | ICD-10-CM | POA: Insufficient documentation

## 2021-03-13 HISTORY — DX: Unspecified malignant neoplasm of skin of right upper limb, including shoulder: C44.602

## 2021-03-18 LAB — ANA SCREEN,IFA,REFLEX TITER/PATTERN,REFLEX MPLX 11 AB CASCADE
14-3-3 eta Protein: <0.2 ng/mL (ref <0.2)
Anti Nuclear Antibody (ANA): NEGATIVE
Cyclic Citrullin Peptide Ab: 25 UNITS — ABNORMAL HIGH
Rheumatoid fact SerPl-aCnc: 26 IU/mL — ABNORMAL HIGH (ref <14)

## 2021-03-18 LAB — LACTATE DEHYDROGENASE: LDH: 180 U/L (ref 120–250)

## 2021-03-19 ENCOUNTER — Ambulatory Visit: Payer: No Typology Code available for payment source | Admitting: Primary Care

## 2021-03-19 ENCOUNTER — Other Ambulatory Visit: Payer: Self-pay | Admitting: Primary Care

## 2021-03-19 DIAGNOSIS — R928 Other abnormal and inconclusive findings on diagnostic imaging of breast: Secondary | ICD-10-CM

## 2021-03-19 DIAGNOSIS — N6489 Other specified disorders of breast: Secondary | ICD-10-CM

## 2021-03-21 NOTE — Telephone Encounter (Signed)
Responded via mychart results.  

## 2021-03-22 DIAGNOSIS — M0579 Rheumatoid arthritis with rheumatoid factor of multiple sites without organ or systems involvement: Secondary | ICD-10-CM

## 2021-03-24 MED ORDER — DICLOFENAC SODIUM 75 MG PO TBEC: 75.0000 mg | DELAYED_RELEASE_TABLET | Freq: Two times a day (BID) | ORAL | 5 refills | Status: DC

## 2021-03-25 ENCOUNTER — Ambulatory Visit
Admission: RE | Admit: 2021-03-25 | Discharge: 2021-03-25 | Disposition: A | Discharge status: Home / Self Care | Payer: No Typology Code available for payment source | Source: Ambulatory Visit | Attending: Primary Care | Admitting: Primary Care

## 2021-03-25 ENCOUNTER — Other Ambulatory Visit: Payer: Self-pay

## 2021-03-25 DIAGNOSIS — R928 Other abnormal and inconclusive findings on diagnostic imaging of breast: Secondary | ICD-10-CM

## 2021-03-25 DIAGNOSIS — N6489 Other specified disorders of breast: Secondary | ICD-10-CM

## 2021-03-25 NOTE — Addendum Note (Signed)
Addended by: Owens Loffler on: 03/25/2021 09:10 AM   Modules accepted: Orders

## 2021-04-29 NOTE — Progress Notes (Signed)
Office Visit Note  Patient: Brianna Tanner             Date of Birth: 06-Mar-1961           MRN: [id number]             PCP: Pleas Koch, NP Referring: Owens Loffler, MD Visit Date: 04/30/2021  Subjective:  New Patient (Initial Visit) (Abnormal labs, patient has not COVID vaccines)   History of Present Illness: Brianna Tanner [name] is a 60 y.o. female here for joint and muscle pains in multiple sites with high inflammatory markers and positive rheumatoid arthritis serology.  Symptoms started pretty acutely around June initially with hip and thigh pain and stiffness with some difficulty moving her legs.  She has some trouble lifting her legs often using her hands to assist in pain more in the posterior of the thigh extending down to the knee.  This worsened and developed pain and stiffness in bilateral shoulders especially worse with any abduction near horizontal or above.  She has seen [facility] and her primary care clinic for this problem imaging was completely nonspecific.  She is given a steroid taper in 1 week which had significant symptom improvement but then returned within 2 weeks of stopping the medication.  To avoid long-term steroid side effects she has been switched to nonsteroidal anti-inflammatory such as diclofenac and some turmeric and Tylenol arthritis.  Scheduled use of this medicine cause increased heartburn symptoms so she had to decrease taking this diclofenac.  Laboratory work-up showed very high sedimentation rate and CRP along with low positive rheumatoid factor and CCP antibodies.  At this time her shoulders are more painful than the hips but both remain problematic.  There is no extension of symptoms beyond the elbows of the knees.  When taking medicine her symptoms are able to improve during the day after initial periods of morning stiffness and worsens with rest.  When not taking any anti-inflammatories she has pain and stiffness lasting all day.   She has not noticed any visible swelling warmth or erythema over any joints during this time. She has a past history of right shoulder bursitis after a fall catching herself on the right side years ago.  This improved with some physical therapy and conservative management and she did not have chronic ongoing problems prior to this episode.  She does not recall any preceding infections or medical events or changes.  She denies any known family history of autoimmune disease inflammatory arthritis..  Labs reviewed 02/2021 ANA neg RF 26 CCP 25 14-3-3 neg ESR 61 hsCRP 36.950 CK 65  03/13/21 DEXA AP Spine L1-L4    60.0 Normal -0.5 1.13 g/cm2  DualFemur Neck Right 60.0 Normal -0.4 0.988 g/cm2   Activities of Daily Living:  Patient reports morning stiffness for 24 hours.   Patient Reports nocturnal pain.  Difficulty dressing/grooming: Reports Difficulty climbing stairs: Reports Difficulty getting out of chair: Reports Difficulty using hands for taps, buttons, cutlery, and/or writing: Denies  Review of Systems  Constitutional:  Positive for fatigue.  HENT:  Negative for mouth dryness.   Eyes:  Negative for dryness.  Respiratory:  Negative for shortness of breath.   Cardiovascular:  Negative for swelling in legs/feet.  Gastrointestinal:  Negative for constipation.  Endocrine: Positive for heat intolerance.  Genitourinary:  Negative for difficulty urinating.  Musculoskeletal:  Positive for joint pain, joint pain, muscle weakness, morning stiffness and muscle tenderness.  Skin:  Negative for rash.  Allergic/Immunologic: Negative for  susceptible to infections.  Neurological:  Positive for weakness.  Hematological:  Negative for bruising/bleeding tendency.  Psychiatric/Behavioral:  Positive for sleep disturbance.    PMFS History:  Patient Active Problem List   Diagnosis Date Noted   Bilateral shoulder pain 04/30/2021   Sedimentation rate elevation 04/30/2021   Easy bruising 11/28/2020    Leukopenia 11/28/2020   Eczema 09/26/2020   Fatigue 09/26/2020   Otalgia 04/06/2019   Preventative health care 03/17/2017   Chronic right shoulder pain 11/05/2016   Hyperlipidemia 11/05/2016    Past Medical History:  Diagnosis Date   Chickenpox    History of kidney stones    Hyperlipidemia    Influenza A 10/05/2018   Skin cancer of arm, right     Family History  Problem Relation Age of Onset   Diabetes Mother    Uterine cancer Mother    Obesity Mother    Arthritis Father    Stroke Father    Hypertension Father    Hyperlipidemia Brother    Stroke Brother    Colon cancer Maternal Grandmother    Cancer Maternal Grandfather        bone marrow cancer    Breast cancer Neg Hx    Past Surgical History:  Procedure Laterality Date   ABLATION  2016   SKIN CANCER EXCISION  08/2015   TUBAL LIGATION     Social History   Social History Narrative   Married.   3 children.   Moved from [place].   Works at [employer]   Enjoys antiquing, hiking.    Immunization History  Administered Date(s) Administered   Td 03/17/2017   Zoster Recombinat (Shingrix) 01/24/2020, 05/30/2020     Objective: Vital Signs: BP (!) 149/81 (BP Location: Right Arm, Patient Position: Sitting, Cuff Size: Normal)   Pulse 73   Resp 16   Ht _0  (1.702 m)   Wt 180 lb (81.6 kg)   BMI 28.19 kg/m    Physical Exam HENT:     Mouth/Throat:     Mouth: Mucous membranes are moist.     Pharynx: Oropharynx is clear.  Eyes:     Conjunctiva/sclera: Conjunctivae normal.  Cardiovascular:     Rate and Rhythm: Normal rate and regular rhythm.  Pulmonary:     Effort: Pulmonary effort is normal.     Breath sounds: Normal breath sounds.  Skin:    General: Skin is warm and dry.     Findings: No rash.  Neurological:     General: No focal deficit present.     Mental Status: She is alert.     Deep Tendon Reflexes: Reflexes normal.  Psychiatric:        Mood and Affect: Mood normal.     Musculoskeletal  Exam:  Bilateral shoulder pain left worse than right and worse with active ROM reduced but passive ROM intact although painful, strength limited by pain  Elbows full ROM no tenderness or swelling Wrists full ROM no tenderness or swelling Fingers full ROM no tenderness or swelling Hip flexion strength intact limited by proximal, anterior thigh pain, hip extension strength normal Knees full ROM, patellofemoral crepitus present, no effusions, flexion mild posterior pain Ankles full ROM no tenderness or swelling    Investigation: No additional findings.  Imaging: No results found.  Recent Labs: Lab Results  Component Value Date   WBC 4.7 12/20/2020   HGB 14.6 12/20/2020   PLT 217 12/20/2020   NA 140 09/26/2020   K 4.2 09/26/2020  CL 101 09/26/2020   CO2 32 09/26/2020   GLUCOSE 86 09/26/2020   BUN 18 09/26/2020   CREATININE 0.65 09/26/2020   BILITOT 0.6 09/26/2020   ALKPHOS 41 09/26/2020   AST 16 09/26/2020   ALT 23 09/26/2020   PROT 6.9 09/26/2020   ALBUMIN 4.5 09/26/2020   CALCIUM 9.4 09/26/2020    Speciality Comments: No specialty comments available.  Procedures:  No procedures performed Allergies: Patient has no known allergies.   Assessment / Plan:     Visit Diagnoses: Chronic pain of both shoulders  Sedimentation rate elevation - Plan: Sedimentation rate, C-reactive protein, Serum protein electrophoresis with reflex, Rheumatoid factor  Bilateral shoulder and hip pain with acute onset high inflammatory markers no involvement of distal peripheral joints appears consistent with polymyalgia rheumatica.  She has weakly positive rheumatoid arthritis serology which is not typical in association with this disease.  However there is no evidence of synovitis in small joints on exam so I question if this is an incidental or false positive result.  Already had imaging and evaluation in orthopedics clinic without underlying structural cause identified.  Differential remains open  but no other systemic complaints to suggest for reactive arthritis or underlying disease state.  We will repeat sedimentation rate and CRP today.  We will also recheck rheumatoid factor titer I would expect equal or decreasing if incidental.  We will check serum protein electrophoresis for any monoclonal gammopathy.  If lab test continued to be consistent for PMR would next recommend trial of longer duration prednisone taper as next step rather than DMARD treatment. She is curious about physical therapy for treatment due to the good improvement in the past for her shoulder, I suspect there is little room for this until the pain and inflammation is at least partially improved.   Orders: Orders Placed This Encounter  Procedures   Sedimentation rate   C-reactive protein   Serum protein electrophoresis with reflex   Rheumatoid factor    No orders of the defined types were placed in this encounter.    Follow-Up Instructions: Return in about 3 weeks (around 05/21/2021) for New pt f/u 3 weeks.   Collier Salina, MD  Note - This record has been created using [employer].  Chart creation errors have been sought, but may not always  have been located. Such creation errors do not reflect on  the standard of medical care.

## 2021-04-30 ENCOUNTER — Ambulatory Visit: Payer: No Typology Code available for payment source | Admitting: Internal Medicine

## 2021-04-30 ENCOUNTER — Other Ambulatory Visit: Payer: Self-pay

## 2021-04-30 ENCOUNTER — Encounter: Payer: Self-pay | Admitting: Internal Medicine

## 2021-04-30 VITALS — BP 149/81 | HR 73 | Resp 16 | Ht 67.0 in | Wt 180.0 lb

## 2021-04-30 DIAGNOSIS — M25512 Pain in left shoulder: Secondary | ICD-10-CM | POA: Insufficient documentation

## 2021-04-30 DIAGNOSIS — R700 Elevated erythrocyte sedimentation rate: Secondary | ICD-10-CM | POA: Insufficient documentation

## 2021-04-30 DIAGNOSIS — G8929 Other chronic pain: Secondary | ICD-10-CM

## 2021-04-30 DIAGNOSIS — M353 Polymyalgia rheumatica: Secondary | ICD-10-CM | POA: Insufficient documentation

## 2021-04-30 DIAGNOSIS — M25511 Pain in right shoulder: Secondary | ICD-10-CM

## 2021-05-06 LAB — PROTEIN ELECTROPHORESIS, SERUM, WITH REFLEX
Albumin ELP: 4.6 g/dL (ref 3.8–4.8)
Alpha 1: 0.3 g/dL (ref 0.2–0.3)
Alpha 2: 0.7 g/dL (ref 0.5–0.9)
Beta 2: 0.3 g/dL (ref 0.2–0.5)
Beta Globulin: 0.4 g/dL (ref 0.4–0.6)
Gamma Globulin: 0.7 g/dL — ABNORMAL LOW (ref 0.8–1.7)
Total Protein: 7.1 g/dL (ref 6.1–8.1)

## 2021-05-06 LAB — SEDIMENTATION RATE: Sed Rate: 11 mm/h (ref 0–30)

## 2021-05-06 LAB — RHEUMATOID FACTOR: Rheumatoid fact SerPl-aCnc: 32 IU/mL — ABNORMAL HIGH (ref <14)

## 2021-05-06 LAB — C-REACTIVE PROTEIN: CRP: 9.4 mg/L — ABNORMAL HIGH (ref <8.0)

## 2021-05-06 LAB — IFE INTERPRETATION: Immunofix Electr Int: NOT DETECTED

## 2021-05-08 ENCOUNTER — Encounter: Payer: Self-pay | Admitting: Internal Medicine

## 2021-05-08 MED ORDER — PREDNISONE 20 MG PO TABS: 20.0000 mg | ORAL_TABLET | Freq: Every day | ORAL | 0 refills | Status: DC

## 2021-05-08 NOTE — Telephone Encounter (Signed)
Patient advised there are not any more test results pending, planned to review results at clinic follow up. Her tests show continued CRP elevation at 9.4, normal is less than 8 but far lower inflammation markers compared to her tests with Dr. Carlota Raspberry. Rheumatoid factor is slightly increasingly positive too.   Patient advised if her symptoms are continuing to be a problem next recommendation would be restarting prednisone with a slower taper. If she wants to go with that, can go ahead and start prednisone 20 mg PO daily for 2 weeks till we follow up with plan to reduce dose after that.  Patient states she is in agreement and would like Prednisone sent to the pharmacy.

## 2021-05-08 NOTE — Telephone Encounter (Signed)
There are not any more test results pending, planned to review results at clinic follow up. Her tests show continued CRP elevation at 9.4, normal is less than 8 but far lower inflammation markers compared to her tests with Dr. Carlota Raspberry. Rheumatoid factor is slightly increasingly positive too.  If her symptoms are continuing to be a problem next recommendation would be restarting prednisone with a slower taper. If she wants to go with that, can go ahead and start prednisone 20 mg PO daily for 2 weeks till we follow up with plan to reduce dose after that.

## 2021-05-20 NOTE — Progress Notes (Signed)
Office Visit Note  Patient: Brianna Tanner             Date of Birth: May 07, 1961           MRN: [id number]             PCP: Pleas Koch, NP Referring: Pleas Koch, NP Visit Date: 05/21/2021   Subjective:  Follow-up (Bil shoulder pain, left is worse)   History of Present Illness: Brianna Tanner is a 60 y.o. female here for follow up with bilateral shoulder and hip pain and high inflammation markers suspected to represent PMR. We restarted prednisone tapering plan, resuming 20 mg daily and follow up for further reduction after repeat exam. She feels a large improvement in symptoms at most sites still has morning stiffness but eases during the day. Her left shoulder remains more painful compared to other sites, particularly with overhead movement. She has not felt any major side effects taking prednisone for a few weeks, but had a few pounds of weight gain.  Previous HPI 04/30/21 Brianna Tanner is a 61 y.o. female here for joint and muscle pains in multiple sites with high inflammatory markers and positive rheumatoid arthritis serology.  Symptoms started pretty acutely around June initially with hip and thigh pain and stiffness with some difficulty moving her legs.  She has some trouble lifting her legs often using her hands to assist in pain more in the posterior of the thigh extending down to the knee.  This worsened and developed pain and stiffness in bilateral shoulders especially worse with any abduction near horizontal or above.  She has seen [facility] and her primary care clinic for this problem imaging was completely nonspecific.  She is given a steroid taper in 1 week which had significant symptom improvement but then returned within 2 weeks of stopping the medication.  To avoid long-term steroid side effects she has been switched to nonsteroidal anti-inflammatory such as diclofenac and some turmeric and Tylenol arthritis.  Scheduled use of  this medicine cause increased heartburn symptoms so she had to decrease taking this diclofenac.  Laboratory work-up showed very high sedimentation rate and CRP along with low positive rheumatoid factor and CCP antibodies.  At this time her shoulders are more painful than the hips but both remain problematic.  There is no extension of symptoms beyond the elbows or the knees.  When taking medicine her symptoms are able to improve during the day after initial periods of morning stiffness and worsens with rest.  When not taking any anti-inflammatories she has pain and stiffness lasting all day.  She has not noticed any visible swelling warmth or erythema over any joints during this time. She has a past history of right shoulder bursitis after a fall catching herself on the right side years ago.  This improved with some physical therapy and conservative management and she did not have chronic ongoing problems prior to this episode.  She does not recall any preceding infections or medical events or changes.  She denies any known family history of autoimmune disease inflammatory arthritis..   Labs reviewed 02/2021 ANA neg RF 26 CCP 25 14-3-3 neg ESR 61 hsCRP 36.950 CK 65   03/13/21 DEXA AP Spine L1-L4             60.0 Normal -0.5 1.13 g/cm2  DualFemur Neck Right 60.0 Normal -0.4 0.988 g/cm2    Review of Systems  Constitutional:  Negative for fatigue.  HENT:  Negative for mouth  dryness.   Eyes:  Negative for dryness.  Respiratory:  Negative for shortness of breath.   Cardiovascular:  Negative for swelling in legs/feet.  Gastrointestinal:  Negative for constipation.  Endocrine: Positive for heat intolerance.  Genitourinary:  Negative for difficulty urinating.  Musculoskeletal:  Positive for joint pain, joint pain, muscle weakness, morning stiffness and muscle tenderness.  Skin:  Negative for rash.  Allergic/Immunologic: Negative for susceptible to infections.  Neurological:  Positive for weakness.   Hematological:  Positive for bruising/bleeding tendency.  Psychiatric/Behavioral:  Negative for sleep disturbance.    PMFS History:  Patient Active Problem List   Diagnosis Date Noted   Bilateral shoulder pain 04/30/2021   Sedimentation rate elevation 04/30/2021   Easy bruising 11/28/2020   Leukopenia 11/28/2020   Eczema 09/26/2020   Fatigue 09/26/2020   Otalgia 04/06/2019   Preventative health care 03/17/2017   Chronic right shoulder pain 11/05/2016   Hyperlipidemia 11/05/2016    Past Medical History:  Diagnosis Date   Chickenpox    History of kidney stones    Hyperlipidemia    Influenza A 10/05/2018   Skin cancer of arm, right     Family History  Problem Relation Age of Onset   Diabetes Mother    Uterine cancer Mother    Obesity Mother    Arthritis Father    Stroke Father    Hypertension Father    Hyperlipidemia Brother    Stroke Brother    Colon cancer Maternal Grandmother    Cancer Maternal Grandfather        bone marrow cancer    Breast cancer Neg Hx    Past Surgical History:  Procedure Laterality Date   ABLATION  2016   SKIN CANCER EXCISION  08/2015   TUBAL LIGATION     Social History   Social History Narrative   Married.   3 children.   Moved from [place].   Works at [employer]   Enjoys antiquing, hiking.    Immunization History  Administered Date(s) Administered   Td 03/17/2017   Zoster Recombinat (Shingrix) 01/24/2020, 05/30/2020     Objective: Vital Signs: BP 130/77 (BP Location: Left Arm, Patient Position: Sitting, Cuff Size: Normal)   Pulse 83   Resp 15   Ht 5' 7"  (1.702 m)   Wt 186 lb (84.4 kg)   BMI 29.13 kg/m    Physical Exam Eyes:     Conjunctiva/sclera: Conjunctivae normal.  Musculoskeletal:     Right lower leg: No edema.     Left lower leg: No edema.  Skin:    General: Skin is warm and dry.     Findings: No rash.  Neurological:     Mental Status: She is alert.  Psychiatric:        Mood and Affect: Mood normal.      Musculoskeletal Exam:  Neck full ROM no tenderness Shoulders right side mildly sore with end abduction, left side guarding against full abduction active or passive ROM, no tenderness to palpation Elbows full ROM no tenderness or swelling Wrists full ROM no tenderness or swelling Fingers full ROM no tenderness or swelling Knees full ROM no tenderness or swelling   Investigation: No additional findings.  Imaging: XR Shoulder Left  Result Date: 05/21/2021 Xray left shoulder 4 views Normal glenohumeral joint space no narrowing ot osteophytes. No abnormal calcifications no demineralization change.s Normal internal and external rotation ROM and position. Possible early osteophyte of AC joint with normla joint space preserved. No visible effusions. Impression No  significant shoulder arthritis that would explain current symptoms   Recent Labs: Lab Results  Component Value Date   WBC 4.7 12/20/2020   HGB 14.6 12/20/2020   PLT 217 12/20/2020   NA 140 09/26/2020   K 4.2 09/26/2020   CL 101 09/26/2020   CO2 32 09/26/2020   GLUCOSE 86 09/26/2020   BUN 18 09/26/2020   CREATININE 0.65 09/26/2020   BILITOT 0.6 09/26/2020   ALKPHOS 41 09/26/2020   AST 16 09/26/2020   ALT 23 09/26/2020   PROT 7.1 04/30/2021   ALBUMIN 4.5 09/26/2020   CALCIUM 9.4 09/26/2020    Speciality Comments: No specialty comments available.  Procedures:  Large Joint Inj: L subacromial bursa on 05/21/2021 11:50 AM Indications: pain Details: 25 G 1.5 in needle, lateral approach Medications: 3 mL lidocaine 1 %; 40 mg triamcinolone acetonide 40 MG/ML Outcome: tolerated well, no immediate complications Consent was given by the patient. Immediately prior to procedure a time out was called to verify the correct patient, procedure, equipment, support staff and site/side marked as required. Patient was prepped and draped in the usual sterile fashion.    Allergies: Patient has no known allergies.   Assessment / Plan:      Visit Diagnoses: Chronic pain of both shoulders - Plan: XR Shoulder Left, predniSONE (DELTASONE) 5 MG tablet  Maybe consistent with PMR although the left shoulder pain remains out of proportion to other symptoms. I suspect there is bursitis or rotator cuff arthropathy contributing to localized pain. Xray today without significant arthritis, no effusion, no calcification. Subacromial bursa triamcinolone injection today. Recommend starting prednisone tapering to reduce side effects down to 15 mg daily then 10 mg then 7.5 mg after 2 weeks at each dose and continue at 7.79m by follow up in about 8 weeks if tolerating, if symptoms worsen can go back a step.  Sedimentation rate elevation   Symptoms currently improved on moderately high dose prednisone, will plan to recheck inflammation tests when tapering to lower dose or if symptom exacerbation.   Orders: Orders Placed This Encounter  Procedures   Large Joint Inj: L subacromial bursa   XR Shoulder Left    Meds ordered this encounter  Medications   predniSONE (DELTASONE) 5 MG tablet    Sig: Take 3 tablets (15 mg total) by mouth daily with breakfast for 14 days, THEN 2 tablets (10 mg total) daily with breakfast for 14 days, THEN 1.5 tablets (7.5 mg total) daily with breakfast for 28 days.    Dispense:  120 tablet    Refill:  0      Follow-Up Instructions: Return in about 8 weeks (around 07/16/2021) for PMR/bursitis GC taper f/u 8wks.   CCollier Salina MD  Note - This record has been created using [employer]  Chart creation errors have been sought, but may not always  have been located. Such creation errors do not reflect on  the standard of medical care.

## 2021-05-21 ENCOUNTER — Encounter: Payer: Self-pay | Admitting: Internal Medicine

## 2021-05-21 ENCOUNTER — Ambulatory Visit: Payer: No Typology Code available for payment source | Admitting: Internal Medicine

## 2021-05-21 ENCOUNTER — Ambulatory Visit: Payer: Self-pay

## 2021-05-21 ENCOUNTER — Other Ambulatory Visit: Payer: Self-pay

## 2021-05-21 VITALS — BP 130/77 | HR 83 | Resp 15 | Ht 67.0 in | Wt 186.0 lb

## 2021-05-21 DIAGNOSIS — R700 Elevated erythrocyte sedimentation rate: Secondary | ICD-10-CM

## 2021-05-21 DIAGNOSIS — M25512 Pain in left shoulder: Secondary | ICD-10-CM

## 2021-05-21 DIAGNOSIS — G8929 Other chronic pain: Secondary | ICD-10-CM | POA: Diagnosis not present

## 2021-05-21 DIAGNOSIS — M25511 Pain in right shoulder: Secondary | ICD-10-CM | POA: Diagnosis not present

## 2021-05-21 MED ORDER — LIDOCAINE HCL 1 % IJ SOLN
3.0000 mL | INTRAMUSCULAR | Status: AC | PRN
  Administered 2021-05-21: 3 mL

## 2021-05-21 MED ORDER — PREDNISONE 5 MG PO TABS: ORAL_TABLET | ORAL | 0 refills | Status: AC

## 2021-05-21 MED ORDER — TRIAMCINOLONE ACETONIDE 40 MG/ML IJ SUSP
40.0000 mg | INTRAMUSCULAR | Status: AC | PRN
  Administered 2021-05-21: 40 mg via INTRA_ARTICULAR

## 2021-05-21 NOTE — Patient Instructions (Signed)
Joint Steroid Injection A joint steroid injection is a procedure to relieve swelling and pain in a joint. Steroids are medicines that reduce inflammation. In this procedure, your health care provider uses a syringe and a needle to inject a steroid medicine into a painful and inflamed joint. A pain-relieving medicine (anesthetic) may be injected along with the steroid. In some cases, your health care provider may use an imaging technique such as ultrasound or fluoroscopy toguide the injection. Joints that are often treated with steroid injections include the knee, shoulder, hip, and spine. These injections may also be used in the elbow, ankle, and joints of the hands or feet. You may have joint steroid injections as part of your treatment for inflammation caused by: Gout. Rheumatoid arthritis. Advanced wear-and-tear arthritis (osteoarthritis). Tendinitis. Bursitis. Joint steroid injections may be repeated, but having them too often can damage a joint or the skin over the joint. You should not have joint steroidinjections less than 6 weeks apart or more than four times a year. Tell a health care provider about: Any allergies you have. All medicines you are taking, including vitamins, herbs, eye drops, creams, and over-the-counter medicines. Any problems you or family members have had with anesthetic medicines. Any blood disorders you have. Any surgeries you have had. Any medical conditions you have. Whether you are pregnant or may be pregnant. What are the risks? Generally, this is a safe treatment. However, problems may occur, including: Infection. Bleeding. Allergic reactions to medicines. Damage to the joint or tissues around the joint. Thinning of skin or loss of skin color over the joint. Temporary flushing of the face or chest. Temporary increase in pain. Temporary increase in blood sugar. Failure to relieve inflammation or pain. What happens before the treatment? Medicines Ask  your health care provider about: Changing or stopping your regular medicines. This is especially important if you are taking diabetes medicines or blood thinners. Taking medicines such as aspirin and ibuprofen. These medicines can thin your blood. Do not take these medicines unless your health care provider tells you to take them. Taking over-the-counter medicines, vitamins, herbs, and supplements. General instructions You may have imaging tests of your joint. Ask your health care provider if you can drive yourself home after the procedure. What happens during the treatment?  Your health care provider will position you for the injection and locate the injection site over your joint. The skin over the joint will be cleaned with a germ-killing soap. Your health care provider may: Spray a numbing solution (topical anesthetic) over the injection site. Inject a local anesthetic under the skin above your joint. The needle will be placed through your skin into your joint. Your health care provider may use imaging to guide the needle to the right spot for the injection. If imaging is used, a special contrast dye may be injected to confirm that the needle is in the correct location. The steroid medicine will be injected into your joint. Anesthetic may be injected along with the steroid. This may be a medicine that relieves pain for a short time (short-acting anesthetic) or for a longer time (long-acting anesthetic). The needle will be removed, and an adhesive bandage (dressing) will be placed over the injection site. The procedure may vary among health care providers and hospitals. What can I expect after the treatment? You will be able to go home after the treatment. It is normal to feel slight flushing for a few days after the injection. After the treatment, it is common to   have an increase in joint pain after the anesthetic has worn off. This may happen about an hour after a short-acting anesthetic  or about 8 hours after a longer-acting anesthetic. You should begin to feel relief from joint pain and swelling after 24 to 48 hours. Contact your health care provider if you do not begin to feel relief after 2 days. Follow these instructions at home: Injection site care Leave the adhesive dressing over your injection site in place until your health care provider says you can remove it. Check your injection site every day for signs of infection. Check for: More redness, swelling, or pain. Fluid or blood. Warmth. Pus or a bad smell. Activity Return to your normal activities as told by your health care provider. Ask your health care provider what activities are safe for you. You may be asked to limit activities that put stress on the joint for a few days. Do joint exercises as told by your health care provider. Do not take baths, swim, or use a hot tub until your health care provider approves. Ask your health care provider if you may take showers. You may only be allowed to take sponge baths. Managing pain, stiffness, and swelling  If directed, put ice on the joint. To do this: Put ice in a plastic bag. Place a towel between your skin and the bag. Leave the ice on for 20 minutes, 2-3 times a day. Remove the ice if your skin turns bright red. This is very important. If you cannot feel pain, heat, or cold, you have a greater risk of damage to the area. Raise (elevate) your joint above the level of your heart when you are sitting or lying down.  General instructions Take over-the-counter and prescription medicines only as told by your health care provider. Do not use any products that contain nicotine or tobacco, such as cigarettes, e-cigarettes, and chewing tobacco. These can delay joint healing. If you need help quitting, ask your health care provider. If you have diabetes, be aware that your blood sugar may be slightly elevated for several days after the injection. Keep all follow-up visits.  This is important. Contact a health care provider if you have: Chills or a fever. Any signs of infection at your injection site. Increased pain or swelling or no relief after 2 days. Summary A joint steroid injection is a treatment to relieve pain and swelling in a joint. Steroids are medicines that reduce inflammation. Your health care provider may add an anesthetic along with the steroid. You may have joint steroid injections as part of your arthritis treatment. Joint steroid injections may be repeated, but having them too often can damage a joint or the skin over the joint. Contact your health care provider if you have a fever, chills, or signs of infection, or if you get no relief from joint pain or swelling. This information is not intended to replace advice given to you by your health care provider. Make sure you discuss any questions you have with your healthcare provider. Document Revised: 12/15/2019 Document Reviewed: 12/15/2019 Elsevier Patient Education  2022 [employer] Inc[employer]  

## 2021-06-15 ENCOUNTER — Other Ambulatory Visit: Payer: Self-pay | Admitting: Internal Medicine

## 2021-06-15 DIAGNOSIS — R700 Elevated erythrocyte sedimentation rate: Secondary | ICD-10-CM

## 2021-06-15 DIAGNOSIS — M25512 Pain in left shoulder: Secondary | ICD-10-CM

## 2021-06-16 ENCOUNTER — Encounter: Payer: Self-pay | Admitting: Internal Medicine

## 2021-06-16 ENCOUNTER — Other Ambulatory Visit: Payer: Self-pay | Admitting: Internal Medicine

## 2021-06-16 DIAGNOSIS — R700 Elevated erythrocyte sedimentation rate: Secondary | ICD-10-CM

## 2021-06-16 DIAGNOSIS — M25512 Pain in left shoulder: Secondary | ICD-10-CM

## 2021-07-04 ENCOUNTER — Other Ambulatory Visit: Payer: Self-pay | Admitting: Internal Medicine

## 2021-07-04 DIAGNOSIS — M25511 Pain in right shoulder: Secondary | ICD-10-CM

## 2021-07-04 DIAGNOSIS — G8929 Other chronic pain: Secondary | ICD-10-CM

## 2021-07-04 DIAGNOSIS — R700 Elevated erythrocyte sedimentation rate: Secondary | ICD-10-CM

## 2021-07-16 ENCOUNTER — Ambulatory Visit: Payer: No Typology Code available for payment source | Admitting: Internal Medicine

## 2021-07-22 ENCOUNTER — Encounter: Payer: Self-pay | Admitting: Internal Medicine

## 2021-07-22 ENCOUNTER — Ambulatory Visit: Payer: No Typology Code available for payment source | Admitting: Internal Medicine

## 2021-07-22 ENCOUNTER — Other Ambulatory Visit: Payer: Self-pay

## 2021-07-22 VITALS — BP 137/78 | HR 83 | Resp 16 | Ht 67.0 in | Wt 190.0 lb

## 2021-07-22 DIAGNOSIS — M353 Polymyalgia rheumatica: Secondary | ICD-10-CM

## 2021-07-22 DIAGNOSIS — R768 Other specified abnormal immunological findings in serum: Secondary | ICD-10-CM

## 2021-07-22 DIAGNOSIS — R700 Elevated erythrocyte sedimentation rate: Secondary | ICD-10-CM | POA: Diagnosis not present

## 2021-07-22 NOTE — Progress Notes (Signed)
Office Visit Note  Patient: Brianna Tanner             Date of Birth: 04/01/1961           MRN: [id number]             PCP: Pleas Koch, NP Referring: Pleas Koch, NP Visit Date: 07/22/2021   Subjective:  Follow-up (Patient has 2 days left of prednisone taper, bil shoulder pain and burning, bil inner thigh pain, neck pain)   History of Present Illness: Brianna Tanner is a 61 y.o. female here for follow up for suspected PMR on prednisone taper current dose of 7.5 mg daily. She felt symptoms were doing very well in December at this dose but became ill with URI symptoms with some all over body aches and subsequently increased pain in all th affected areas. Remains worst and stiff in the mornings with improvement through the day.  Previous HPI 05/21/21 Brianna Tanner Brianna Tanner is a 61 y.o. female here for follow up with bilateral shoulder and hip pain and high inflammation markers suspected to represent PMR. We restarted prednisone tapering plan, resuming 20 mg daily and follow up for further reduction after repeat exam. She feels a large improvement in symptoms at most sites still has morning stiffness but eases during the day. Her left shoulder remains more painful compared to other sites, particularly with overhead movement. She has not felt any major side effects taking prednisone for a few weeks, but had a few pounds of weight gain.   Previous HPI 04/30/21 [place] Tanner Brianna Tanner is a 61 y.o. female here for joint and muscle pains in multiple sites with high inflammatory markers and positive rheumatoid arthritis serology.  Symptoms started pretty acutely around June initially with hip and thigh pain and stiffness with some difficulty moving her legs.  She has some trouble lifting her legs often using her hands to assist in pain more in the posterior of the thigh extending down to the knee.  This worsened and developed pain and stiffness in bilateral  shoulders especially worse with any abduction near horizontal or above.  She has seen [facility] and her primary care clinic for this problem imaging was completely nonspecific.  She is given a steroid taper in 1 week which had significant symptom improvement but then returned within 2 weeks of stopping the medication.  To avoid long-term steroid side effects she has been switched to nonsteroidal anti-inflammatory such as diclofenac and some turmeric and Tylenol arthritis. Scheduled use of this medicine cause increased heartburn symptoms so she had to decrease taking this diclofenac.  Laboratory work-up showed very high sedimentation rate and CRP along with low positive rheumatoid factor and CCP antibodies. At this time her shoulders are more painful than the hips but both remain problematic.  There is no extension of symptoms beyond the elbows or the knees.  When taking medicine her symptoms are able to improve during the day after initial periods of morning stiffness and worsens with rest.  When not taking any anti-inflammatories she has pain and stiffness lasting all day.  She has not noticed any visible swelling warmth or erythema over any joints during this time. She has a past history of right shoulder bursitis after a fall catching herself on the right side years ago.  This improved with some physical therapy and conservative management and she did not have chronic ongoing problems prior to this episode. She does not recall any preceding infections or medical events  or changes.  She denies any known family history of autoimmune disease inflammatory arthritis..   Review of Systems  Constitutional:  Positive for fatigue.  HENT:  Negative for mouth dryness.   Eyes:  Negative for dryness.  Respiratory:  Negative for shortness of breath.   Cardiovascular:  Negative for swelling in legs/feet.  Gastrointestinal:  Negative for constipation.  Endocrine: Negative for excessive thirst.  Genitourinary:   Negative for difficulty urinating.  Musculoskeletal:  Positive for joint pain, joint pain, muscle weakness, morning stiffness and muscle tenderness.  Skin:  Negative for rash.  Allergic/Immunologic: Negative for susceptible to infections.  Neurological:  Positive for weakness.  Hematological:  Negative for bruising/bleeding tendency.  Psychiatric/Behavioral:  Negative for sleep disturbance.    PMFS History:  Patient Active Problem List   Diagnosis Date Noted   Rheumatoid factor positive 07/22/2021   PMR (polymyalgia rheumatica) (HCC) 04/30/2021   Sedimentation rate elevation 04/30/2021   Easy bruising 11/28/2020   Leukopenia 11/28/2020   Eczema 09/26/2020   Fatigue 09/26/2020   Otalgia 04/06/2019   Preventative health care 03/17/2017   Chronic right shoulder pain 11/05/2016   Hyperlipidemia 11/05/2016    Past Medical History:  Diagnosis Date   Chickenpox    History of kidney stones    Hyperlipidemia    Influenza A 10/05/2018   Skin cancer of arm, right     Family History  Problem Relation Age of Onset   Diabetes Mother    Uterine cancer Mother    Obesity Mother    Arthritis Father    Stroke Father    Hypertension Father    Hyperlipidemia Brother    Stroke Brother    Colon cancer Maternal Grandmother    Cancer Maternal Grandfather        bone marrow cancer    Breast cancer Neg Hx    Past Surgical History:  Procedure Laterality Date   ABLATION  2016   SKIN CANCER EXCISION  08/2015   TUBAL LIGATION     Social History   Social History Narrative   Married.   3 children.   Moved from [place].   Works at [employer]   Enjoys antiquing, hiking.    Immunization History  Administered Date(s) Administered   Td 03/17/2017   Zoster Recombinat (Shingrix) 01/24/2020, 05/30/2020     Objective: Vital Signs: BP 137/78 (BP Location: Left Arm, Patient Position: Sitting, Cuff Size: Normal)    Pulse 83    Resp 16    Ht 5' 7"  (1.702 m)    Wt 190 lb (86.2 kg)    BMI 29.76  kg/m    Physical Exam Cardiovascular:     Rate and Rhythm: Normal rate and regular rhythm.  Pulmonary:     Effort: Pulmonary effort is normal.     Breath sounds: Normal breath sounds.  Musculoskeletal:     Right lower leg: No edema.     Left lower leg: No edema.  Skin:    General: Skin is warm and dry.  Neurological:     Mental Status: She is alert.  Psychiatric:        Mood and Affect: Mood normal.     Musculoskeletal Exam:  Neck full ROM no tenderness Shoulders full ROM, no swelling, tenderness over upper back muscles with resisted abduction Elbows full ROM no tenderness or swelling Wrists full ROM no tenderness or swelling Fingers full ROM no tenderness or swelling Anterior hip pain around groin area with flexion, mildly decreased strength Knees full  ROM, tender on medial side, no swelling Ankles full ROM no tenderness or swelling   Investigation: No additional findings.  Imaging: No results found.  Recent Labs: Lab Results  Component Value Date   WBC 4.7 12/20/2020   HGB 14.6 12/20/2020   PLT 217 12/20/2020   NA 140 09/26/2020   K 4.2 09/26/2020   CL 101 09/26/2020   CO2 32 09/26/2020   GLUCOSE 86 09/26/2020   BUN 18 09/26/2020   CREATININE 0.65 09/26/2020   BILITOT 0.6 09/26/2020   ALKPHOS 41 09/26/2020   AST 16 09/26/2020   ALT 23 09/26/2020   PROT 7.1 04/30/2021   ALBUMIN 4.5 09/26/2020   CALCIUM 9.4 09/26/2020    Speciality Comments: No specialty comments available.  Procedures:  No procedures performed Allergies: Patient has no known allergies.   Assessment / Plan:     Visit Diagnoses: PMR (polymyalgia rheumatica) ([place]) - Plan: Sedimentation rate, C-reactive protein  Some increase of symptoms so far with reduced prednisone dose. Plan to recheck ESR and CRP if these are increased would be highly consistent with PMR disease activity and will need to go back to higher dose if normal would try to remain at 10 mg and just slow the  reduction.  Sedimentation rate elevation  Rheumatoid factor positive  Positive RF at slightly increased titer no objective peripheral joint inflammation again today despite some increase in symptoms. Lower threshold to recommend trial of adding methotrexate if unable to tolerated steroid tapering.  Orders: Orders Placed This Encounter  Procedures   Sedimentation rate   C-reactive protein   No orders of the defined types were placed in this encounter.    Follow-Up Instructions: Return in about 3 months (around 10/20/2021) for PMR on GC taper f/u 58mo.   CCollier Salina MD  Note - This record has been created using [employer]  Chart creation errors have been sought, but may not always  have been located. Such creation errors do not reflect on  the standard of medical care.

## 2021-07-23 LAB — SEDIMENTATION RATE: Sed Rate: 6 mm/h (ref 0–30)

## 2021-07-23 LAB — C-REACTIVE PROTEIN: CRP: 3.7 mg/L (ref <8.0)

## 2021-07-24 ENCOUNTER — Encounter: Payer: Self-pay | Admitting: Internal Medicine

## 2021-07-24 DIAGNOSIS — M353 Polymyalgia rheumatica: Secondary | ICD-10-CM

## 2021-07-25 MED ORDER — PREDNISONE 1 MG PO TABS: ORAL_TABLET | ORAL | 1 refills | Status: DC

## 2021-07-25 MED ORDER — PREDNISONE 5 MG PO TABS: ORAL_TABLET | ORAL | 0 refills | Status: DC

## 2021-07-30 ENCOUNTER — Encounter: Payer: Self-pay | Admitting: Internal Medicine

## 2021-08-05 ENCOUNTER — Encounter: Payer: Self-pay | Admitting: Internal Medicine

## 2021-08-11 ENCOUNTER — Encounter: Payer: Self-pay | Admitting: Internal Medicine

## 2021-08-11 DIAGNOSIS — M353 Polymyalgia rheumatica: Secondary | ICD-10-CM

## 2021-08-11 NOTE — Telephone Encounter (Signed)
Next Visit: 10/23/2021  Last Visit: 07/22/2021  Last Fill: 07/25/2021  Dx: PMR (polymyalgia rheumatica)  Okay to refill Prednisone?

## 2021-08-12 MED ORDER — PREDNISONE 5 MG PO TABS: 15.0000 mg | ORAL_TABLET | Freq: Every day | ORAL | 0 refills | Status: DC

## 2021-09-04 ENCOUNTER — Other Ambulatory Visit: Payer: Self-pay | Admitting: Internal Medicine

## 2021-09-04 DIAGNOSIS — M353 Polymyalgia rheumatica: Secondary | ICD-10-CM

## 2021-09-04 NOTE — Telephone Encounter (Signed)
Next Visit: 10/23/2021   Last Visit: 07/22/2021   Last Fill: 08/12/2021   Dx: PMR (polymyalgia rheumatica)   Okay to refill Prednisone?

## 2021-09-05 NOTE — Telephone Encounter (Signed)
I recommend we try to move up her appointment time to within the next month if possible since she is remaining at this higher dose of prednisone. We originally planned to taper after January visit.

## 2021-09-13 ENCOUNTER — Other Ambulatory Visit: Payer: Self-pay | Admitting: Internal Medicine

## 2021-09-13 DIAGNOSIS — M353 Polymyalgia rheumatica: Secondary | ICD-10-CM

## 2021-09-24 ENCOUNTER — Encounter: Payer: Self-pay | Admitting: Internal Medicine

## 2021-09-28 ENCOUNTER — Other Ambulatory Visit: Payer: Self-pay | Admitting: Internal Medicine

## 2021-09-28 DIAGNOSIS — M353 Polymyalgia rheumatica: Secondary | ICD-10-CM

## 2021-10-04 ENCOUNTER — Encounter: Payer: Self-pay | Admitting: Internal Medicine

## 2021-10-10 ENCOUNTER — Ambulatory Visit: Payer: No Typology Code available for payment source | Admitting: Internal Medicine

## 2021-10-23 ENCOUNTER — Ambulatory Visit: Payer: No Typology Code available for payment source | Admitting: Internal Medicine

## 2021-11-11 NOTE — Progress Notes (Signed)
? ?Office Visit Note ? ?Patient: Brianna Tanner             ?Date of Birth: Jun 27, 1961           ?MRN: [id number]             ?PCP: Pleas Koch, NP ?Referring: Pleas Koch, NP ?Visit Date: 11/12/2021 ? ? ?Subjective:  ?Follow-up (Improving) ? ? ?History of Present Illness: Brianna Tanner Brianna Tanner is a 61 y.o. female here for follow up for PMR on prednisone taper currently taking 5 mg daily. She decreased her steroid dose with some increase in joint pains so temporarily increased dose but back down to '5mg'$  since the end of March. She recalls having also stopped taking antiinflammatory supplements at the time which may have contributed. Right shoulder bursitis is mildly symptomatic and she has some pain in her hips and upper legs. Doing pretty well today.  ? ?Previous HPI ?07/22/21 ?Brianna Tanner Brianna Tanner is a 61 y.o. female here for follow up for suspected PMR on prednisone taper current dose of 7.5 mg daily. She felt symptoms were doing very well in December at this dose but became ill with URI symptoms with some all over body aches and subsequently increased pain in all th affected areas. Remains worst and stiff in the mornings with improvement through the day. ?   ?Previous HPI ?04/30/21 ?Brianna Tanner Brianna Tanner is a 61 y.o. female here for joint and muscle pains in multiple sites with high inflammatory markers and positive rheumatoid arthritis serology.  Symptoms started pretty acutely around June initially with hip and thigh pain and stiffness with some difficulty moving her legs.  She has some trouble lifting her legs often using her hands to assist in pain more in the posterior of the thigh extending down to the knee.  This worsened and developed pain and stiffness in bilateral shoulders especially worse with any abduction near horizontal or above.  She has seen [facility] and her primary care clinic for this problem imaging was completely nonspecific.  She is given a steroid taper  in 1 week which had significant symptom improvement but then returned within 2 weeks of stopping the medication.  To avoid long-term steroid side effects she has been switched to nonsteroidal anti-inflammatory such as diclofenac and some turmeric and Tylenol arthritis. Scheduled use of this medicine cause increased heartburn symptoms so she had to decrease taking this diclofenac.  Laboratory work-up showed very high sedimentation rate and CRP along with low positive rheumatoid factor and CCP antibodies. At this time her shoulders are more painful than the hips but both remain problematic.  There is no extension of symptoms beyond the elbows or the knees.  When taking medicine her symptoms are able to improve during the day after initial periods of morning stiffness and worsens with rest.  When not taking any anti-inflammatories she has pain and stiffness lasting all day.  She has not noticed any visible swelling warmth or erythema over any joints during this time. ?She has a past history of right shoulder bursitis after a fall catching herself on the right side years ago.  This improved with some physical therapy and conservative management and she did not have chronic ongoing problems prior to this episode. She does not recall any preceding infections or medical events or changes.  She denies any known family history of autoimmune disease inflammatory arthritis.. ? ? ?Review of Systems  ?Constitutional:  Positive for fatigue.  ?HENT:  Negative for mouth dryness.   ?  Eyes:  Negative for dryness.  ?Respiratory:  Negative for shortness of breath.   ?Cardiovascular:  Negative for swelling in legs/feet.  ?Gastrointestinal:  Negative for constipation.  ?Endocrine: Positive for heat intolerance.  ?Genitourinary:  Negative for difficulty urinating.  ?Musculoskeletal:  Positive for joint pain, joint pain, morning stiffness and muscle tenderness.  ?Skin:  Negative for rash.  ?Allergic/Immunologic: Negative for susceptible to  infections.  ?Neurological:  Negative for numbness.  ?Hematological:  Negative for bruising/bleeding tendency.  ?Psychiatric/Behavioral:  Positive for sleep disturbance.   ? ?PMFS History:  ?Patient Active Problem List  ? Diagnosis Date Noted  ? Rheumatoid factor positive 07/22/2021  ? PMR (polymyalgia rheumatica) ([place]) 04/30/2021  ? Sedimentation rate elevation 04/30/2021  ? Easy bruising 11/28/2020  ? Leukopenia 11/28/2020  ? Eczema 09/26/2020  ? Fatigue 09/26/2020  ? Otalgia 04/06/2019  ? Preventative health care 03/17/2017  ? Chronic right shoulder pain 11/05/2016  ? Hyperlipidemia 11/05/2016  ?  ?Past Medical History:  ?Diagnosis Date  ? Chickenpox   ? History of kidney stones   ? Hyperlipidemia   ? Influenza A 10/05/2018  ? Skin cancer of arm, right   ?  ?Family History  ?Problem Relation Age of Onset  ? Diabetes Mother   ? Uterine cancer Mother   ? Obesity Mother   ? Arthritis Father   ? Stroke Father   ? Hypertension Father   ? Hyperlipidemia Brother   ? Stroke Brother   ? Colon cancer Maternal Grandmother   ? Cancer Maternal Grandfather   ?     bone marrow cancer   ? Breast cancer Neg Hx   ? ?Past Surgical History:  ?Procedure Laterality Date  ? ABLATION  2016  ? SKIN CANCER EXCISION  08/2015  ? TUBAL LIGATION    ? ?Social History  ? ?Social History Narrative  ? Married.  ? 3 children.  ? Moved from [place].  ? Works at [employer]  ? Enjoys antiquing, hiking.   ? ?Immunization History  ?Administered Date(s) Administered  ? Td 03/17/2017  ? Zoster Recombinat (Shingrix) 01/24/2020, 05/30/2020  ?  ? ?Objective: ?Vital Signs: BP (!) 146/81 (BP Location: Left Arm, Patient Position: Sitting, Cuff Size: Normal)   Pulse 75   Resp 16   Ht '5\' 7"'$  (1.702 m)   Wt 190 lb (86.2 kg)   BMI 29.76 kg/m?   ? ?Physical Exam ?Cardiovascular:  ?   Rate and Rhythm: Normal rate and regular rhythm.  ?Pulmonary:  ?   Effort: Pulmonary effort is normal.  ?   Breath sounds: Normal breath sounds.  ?Musculoskeletal:  ?   Right  lower leg: No edema.  ?   Left lower leg: No edema.  ?Skin: ?   General: Skin is warm and dry.  ?   Findings: No rash.  ?Neurological:  ?   Mental Status: She is alert.  ?Psychiatric:     ?   Mood and Affect: Mood normal.  ?  ? ?Musculoskeletal Exam:  ?Shoulders full ROM no tenderness or swelling ?Elbows full ROM no tenderness or swelling ?Wrists full ROM no tenderness or swelling ?Fingers full ROM no tenderness or swelling ?Mild right lateral hip tenderness to palpation, left lateral thigh tenderness ?Knees full ROM no tenderness or swelling ? ? ?Investigation: ?No additional findings. ? ?Imaging: ?No results found. ? ?Recent Labs: ?Lab Results  ?Component Value Date  ? WBC 4.7 12/20/2020  ? HGB 14.6 12/20/2020  ? PLT 217 12/20/2020  ? NA 140  09/26/2020  ? K 4.2 09/26/2020  ? CL 101 09/26/2020  ? CO2 32 09/26/2020  ? GLUCOSE 86 09/26/2020  ? BUN 18 09/26/2020  ? CREATININE 0.65 09/26/2020  ? BILITOT 0.6 09/26/2020  ? ALKPHOS 41 09/26/2020  ? AST 16 09/26/2020  ? ALT 23 09/26/2020  ? PROT 7.1 04/30/2021  ? ALBUMIN 4.5 09/26/2020  ? CALCIUM 9.4 09/26/2020  ? ? ?Speciality Comments: No specialty comments available. ? ?Procedures:  ?No procedures performed ?Allergies: Patient has no known allergies.  ? ?Assessment / Plan:     ?Visit Diagnoses: PMR (polymyalgia rheumatica) ([place]) - Plan: predniSONE (DELTASONE) 1 MG tablet ? ?She is doing quite well now and tapered down to 5 mg daily prednisone. Discussed options she prefers to try tapering further without starting methotrexate as a steroid sparing medication. We can revisit if symptoms develop while doing so. Plan to taper off by 1 mg increments over next 2 months. ? ?Orders: ?No orders of the defined types were placed in this encounter. ? ?Meds ordered this encounter  ?Medications  ? predniSONE (DELTASONE) 1 MG tablet  ?  Sig: Take 4 tablets (4 mg total) by mouth daily with breakfast for 14 days, THEN 3 tablets (3 mg total) daily with breakfast for 14 days, THEN 2  tablets (2 mg total) daily with breakfast for 14 days, THEN 1 tablet (1 mg total) daily with breakfast for 14 days. Take along with 5 mg tablets for total daily dose as directed.  ?  Dispense:  140 tablet  ?  Refi

## 2021-11-12 ENCOUNTER — Ambulatory Visit: Payer: No Typology Code available for payment source | Admitting: Internal Medicine

## 2021-11-12 ENCOUNTER — Encounter: Payer: Self-pay | Admitting: Internal Medicine

## 2021-11-12 VITALS — BP 146/81 | HR 75 | Resp 16 | Ht 67.0 in | Wt 190.0 lb

## 2021-11-12 DIAGNOSIS — M353 Polymyalgia rheumatica: Secondary | ICD-10-CM | POA: Diagnosis not present

## 2021-11-12 MED ORDER — PREDNISONE 1 MG PO TABS: ORAL_TABLET | ORAL | 0 refills | Status: DC

## 2021-11-23 IMAGING — MG MM DIGITAL DIAGNOSTIC UNILAT*L* W/ TOMO W/ CAD
4 series · 4 of 12 positions shown · non-contrast
Comparison: Previous exam(s).

CLINICAL DATA: Recall for a possible asymmetry in the left breast.

EXAM:
DIGITAL DIAGNOSTIC UNILATERAL LEFT MAMMOGRAM WITH TOMOSYNTHESIS AND
CAD
TECHNIQUE: Left digital diagnostic mammography and breast tomosynthesis was
performed. The images were evaluated with computer-aided detection.

[L ML synth-2D]
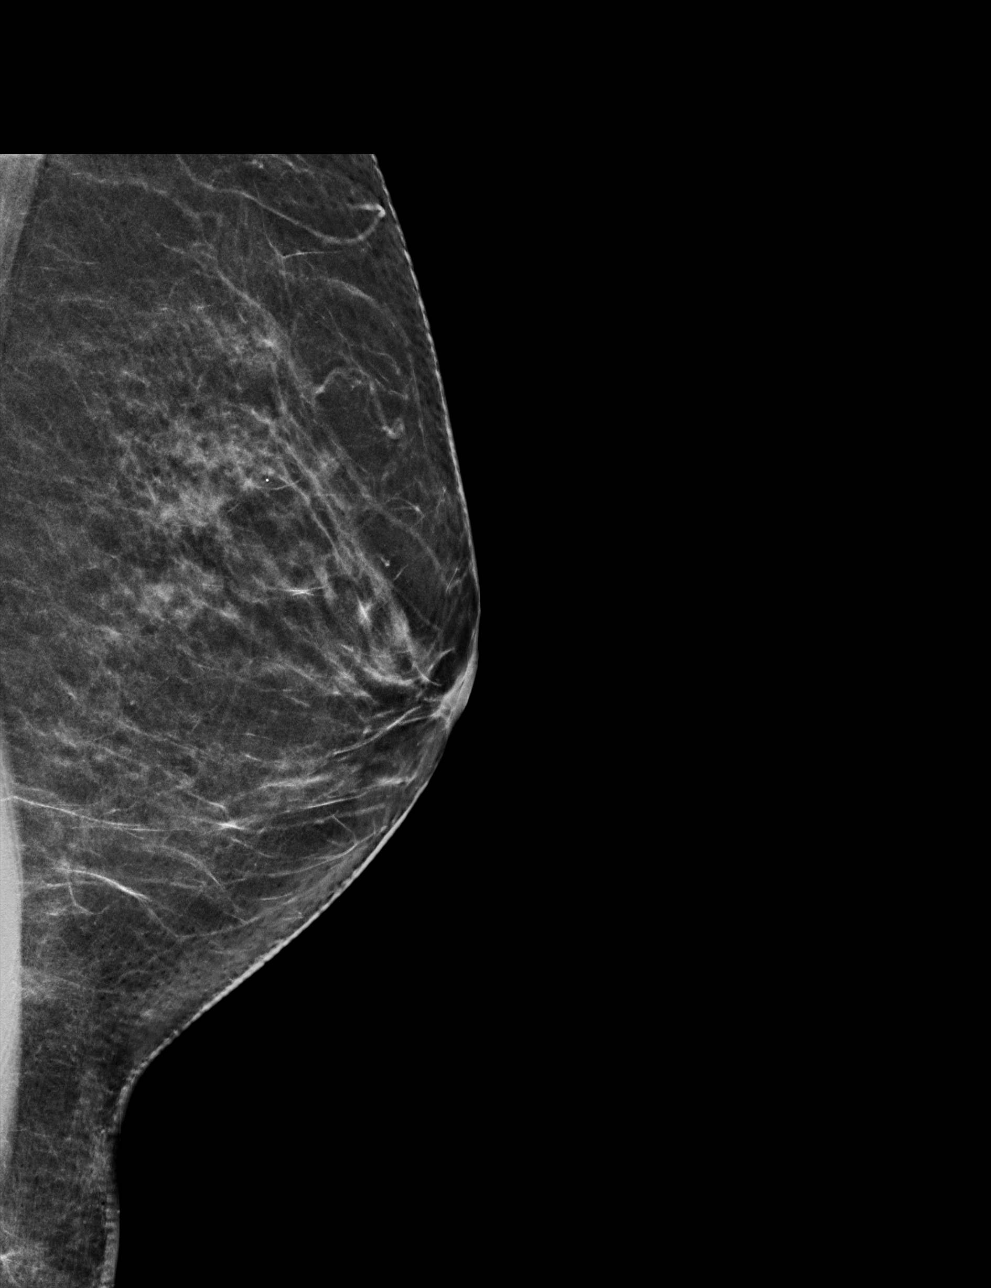

[L MLO synth-2D]
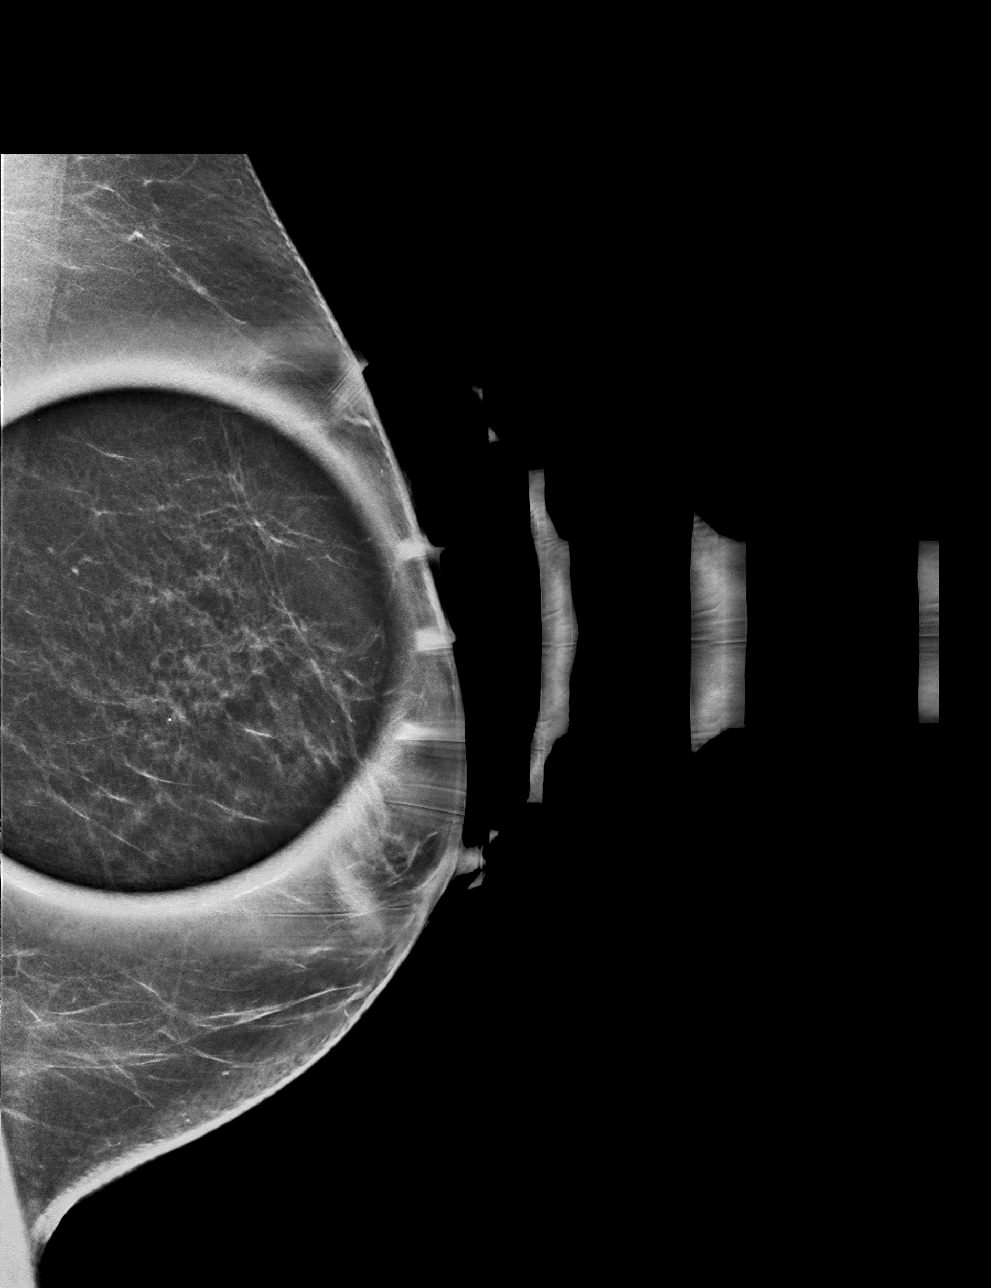

[L ML tomo · tomo slice 29/58.0]
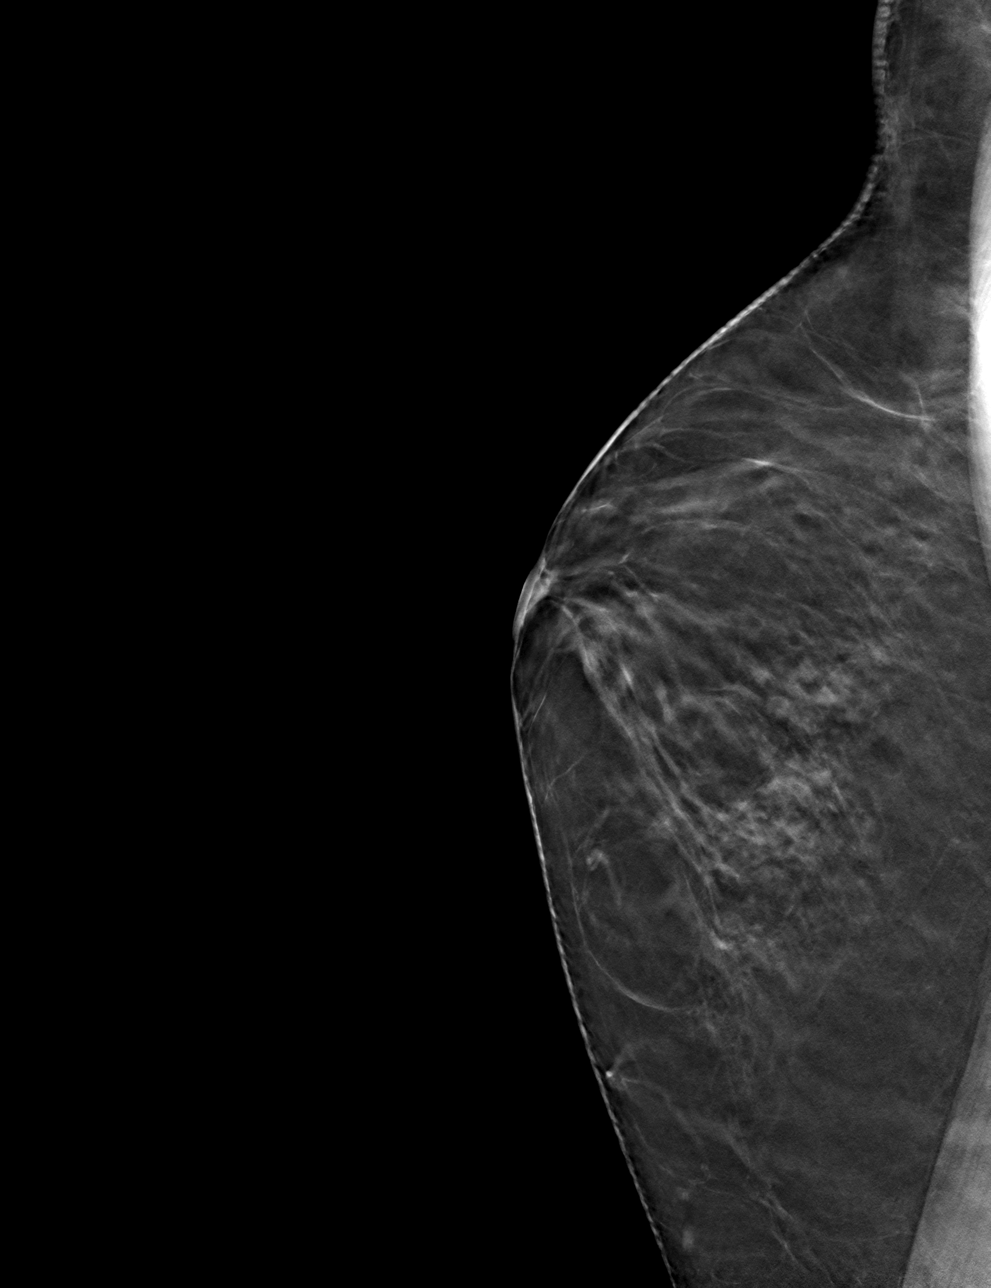

[L MLO tomo · tomo slice 29/56.0]
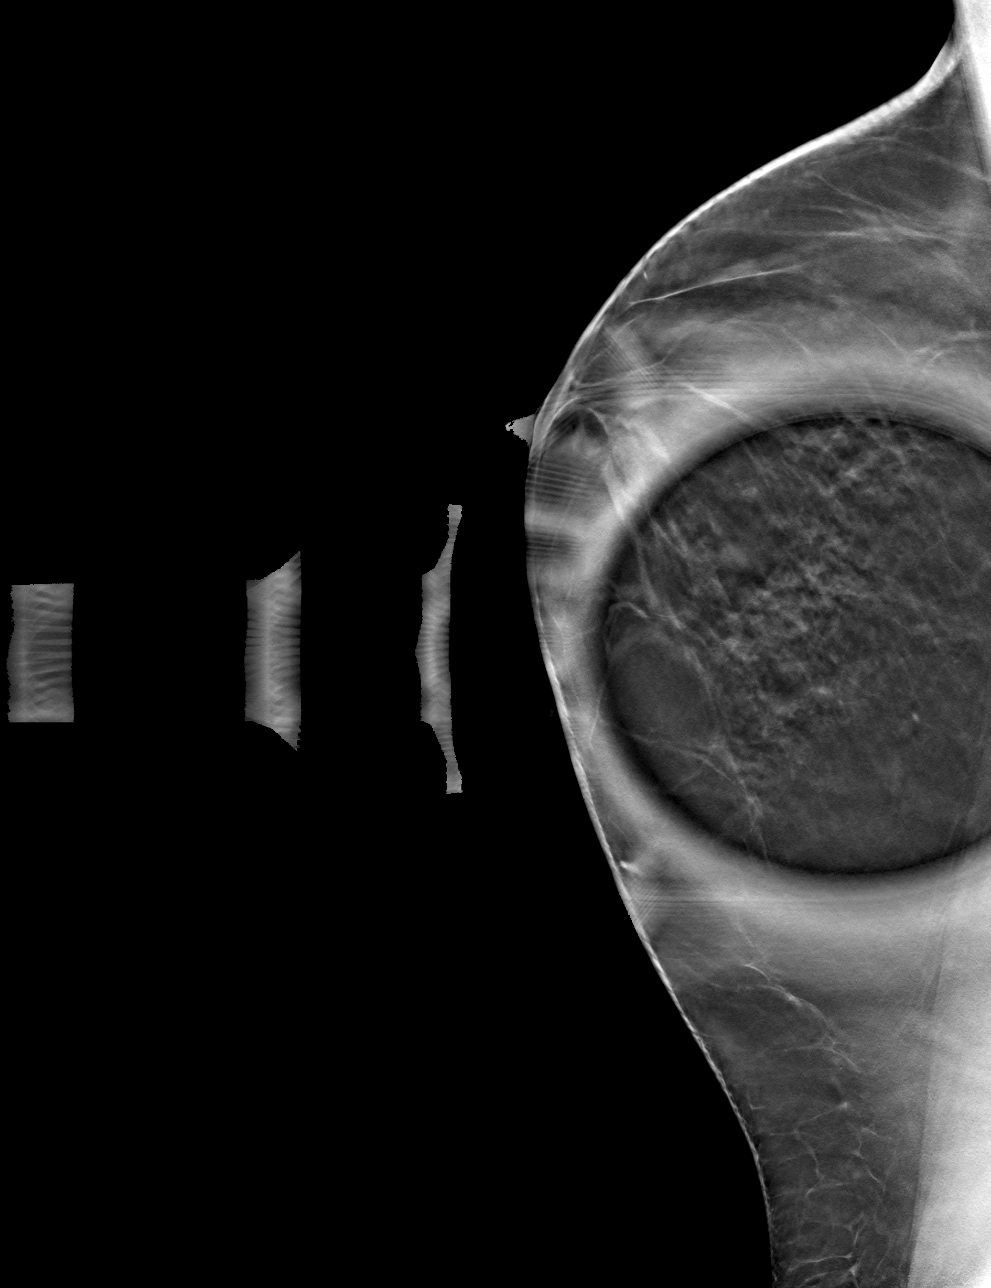

[4 of 12 positions shown; findings below may reference images not displayed]

ACR Breast Density Category b: There are scattered areas of
fibroglandular density.
FINDINGS: The previously noted possible asymmetry upper breast on MLO view
only does not persist on additional imaging, consistent with
overlapping fibroglandular tissue.
IMPRESSION: No mammographic findings of malignancy.

RECOMMENDATION:
Annual screening mammogram.

I have discussed the findings and recommendations with the patient.
If applicable, a reminder letter will be sent to the patient
regarding the next appointment.

BI-RADS CATEGORY  1: Negative.

## 2022-01-01 ENCOUNTER — Ambulatory Visit: Payer: No Typology Code available for payment source | Admitting: Family Medicine

## 2022-01-01 ENCOUNTER — Encounter: Payer: Self-pay | Admitting: Family Medicine

## 2022-01-01 DIAGNOSIS — T2121XA Burn of second degree of chest wall, initial encounter: Secondary | ICD-10-CM

## 2022-01-01 MED ORDER — SILVER SULFADIAZINE 1 % EX CREA: 1.0000 | TOPICAL_CREAM | Freq: Every day | CUTANEOUS | 0 refills | Status: DC

## 2022-01-01 NOTE — Patient Instructions (Signed)
I would use silvadene with a nonstick bandage.  Update [name] as needed.  Take care.  Glad to see you.
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
## 2022-01-04 DIAGNOSIS — T2121XA Burn of second degree of chest wall, initial encounter: Secondary | ICD-10-CM | POA: Insufficient documentation

## 2022-01-04 HISTORY — DX: Burn of second degree of chest wall, initial encounter: T21.21XA

## 2022-01-04 NOTE — Assessment & Plan Note (Signed)
Should resolve.  Routine cautions given to patient.  Cover with nonstick bandage and Silvadene daily and update me as needed.

## 2022-01-05 ENCOUNTER — Other Ambulatory Visit: Payer: Self-pay | Admitting: Internal Medicine

## 2022-01-05 ENCOUNTER — Encounter: Payer: Self-pay | Admitting: Internal Medicine

## 2022-01-05 DIAGNOSIS — M353 Polymyalgia rheumatica: Secondary | ICD-10-CM

## 2022-01-05 NOTE — Telephone Encounter (Signed)
I called patient, patient is not taking Prednisone, RX refill refused.

## 2022-01-06 ENCOUNTER — Encounter: Payer: Self-pay | Admitting: Internal Medicine

## 2022-02-10 ENCOUNTER — Encounter: Payer: No Typology Code available for payment source | Admitting: Primary Care

## 2022-02-18 ENCOUNTER — Encounter: Payer: Self-pay | Admitting: Primary Care

## 2022-02-18 ENCOUNTER — Ambulatory Visit (INDEPENDENT_AMBULATORY_CARE_PROVIDER_SITE_OTHER): Payer: No Typology Code available for payment source | Admitting: Primary Care

## 2022-02-18 VITALS — BP 136/78 | HR 78 | Temp 98.6°F | Ht 67.0 in | Wt 183.0 lb

## 2022-02-18 DIAGNOSIS — Z1231 Encounter for screening mammogram for malignant neoplasm of breast: Secondary | ICD-10-CM

## 2022-02-18 DIAGNOSIS — E782 Mixed hyperlipidemia: Secondary | ICD-10-CM

## 2022-02-18 DIAGNOSIS — Z1159 Encounter for screening for other viral diseases: Secondary | ICD-10-CM

## 2022-02-18 DIAGNOSIS — M353 Polymyalgia rheumatica: Secondary | ICD-10-CM

## 2022-02-18 DIAGNOSIS — R768 Other specified abnormal immunological findings in serum: Secondary | ICD-10-CM

## 2022-02-18 DIAGNOSIS — Z0001 Encounter for general adult medical examination with abnormal findings: Secondary | ICD-10-CM

## 2022-02-18 LAB — COMPREHENSIVE METABOLIC PANEL
ALT: 14 U/L (ref 0–35)
AST: 14 U/L (ref 0–37)
Albumin: 4.7 g/dL (ref 3.5–5.2)
Alkaline Phosphatase: 38 U/L — ABNORMAL LOW (ref 39–117)
BUN: 18 mg/dL (ref 6–23)
CO2: 32 mEq/L (ref 19–32)
Calcium: 9.9 mg/dL (ref 8.4–10.5)
Chloride: 101 mEq/L (ref 96–112)
Creatinine, Ser: 0.73 mg/dL (ref 0.40–1.20)
GFR: 89.01 mL/min (ref >60.00)
Glucose, Bld: 97 mg/dL (ref 70–99)
Potassium: 4.5 mEq/L (ref 3.5–5.1)
Sodium: 139 mEq/L (ref 135–145)
Total Bilirubin: 0.5 mg/dL (ref 0.2–1.2)
Total Protein: 7.5 g/dL (ref 6.0–8.3)

## 2022-02-18 LAB — LIPID PANEL
Cholesterol: 232 mg/dL — ABNORMAL HIGH (ref 0–200)
HDL: 49.7 mg/dL (ref >39.00)
LDL Cholesterol: 142 mg/dL — ABNORMAL HIGH (ref 0–99)
NonHDL: 182.03
Total CHOL/HDL Ratio: 5
Triglycerides: 198 mg/dL — ABNORMAL HIGH (ref 0.0–149.0)
VLDL: 39.6 mg/dL (ref 0.0–40.0)

## 2022-02-18 LAB — CBC
HCT: 39.2 % (ref 36.0–46.0)
Hemoglobin: 13.3 g/dL (ref 12.0–15.0)
MCHC: 33.9 g/dL (ref 30.0–36.0)
MCV: 90.5 fl (ref 78.0–100.0)
Platelets: 226 10*3/uL (ref 150.0–400.0)
RBC: 4.34 Mil/uL (ref 3.87–5.11)
RDW: 12.4 % (ref 11.5–15.5)
WBC: 5 10*3/uL (ref 4.0–10.5)

## 2022-02-18 MED ORDER — DULOXETINE HCL 20 MG PO CPEP: 20.0000 mg | ORAL_CAPSULE | Freq: Every day | ORAL | 0 refills | Status: DC

## 2022-02-18 NOTE — Assessment & Plan Note (Signed)
Repeat lipid panel pending. Not on treatment.  
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
## 2022-02-18 NOTE — Patient Instructions (Signed)
Stop by the lab prior to leaving today. I will notify you of your results once received.   [facility].   Start duloxetine (Cymbalta) 20 mg once daily for pain. Please update me in about 1 month via MyChart.   It was a pleasure to see you today!  Preventive Care 31-61 Years Old, Female Preventive care refers to lifestyle choices and visits with your health care provider that can promote health and wellness. Preventive care visits are also called wellness exams. What can I expect for my preventive care visit? Counseling Your health care provider may ask you questions about your: Medical history, including: Past medical problems. Family medical history. Pregnancy history. Current health, including: Menstrual cycle. Method of birth control. Emotional well-being. Home life and relationship well-being. Sexual activity and sexual health. Lifestyle, including: Alcohol, nicotine or tobacco, and drug use. Access to firearms. Diet, exercise, and sleep habits. Work and work Statistician. Sunscreen use. Safety issues such as seatbelt and bike helmet use. Physical exam Your health care provider will check your: Height and weight. These may be used to calculate your BMI (body mass index). BMI is a measurement that tells if you are at a healthy weight. Waist circumference. This measures the distance around your waistline. This measurement also tells if you are at a healthy weight and may help predict your risk of certain diseases, such as type 2 diabetes and high blood pressure. Heart rate and blood pressure. Body temperature. Skin for abnormal spots. What immunizations do I need?  Vaccines are usually given at various ages, according to a schedule. Your health care provider will recommend vaccines for you based on your age, medical history, and lifestyle or other factors, such as travel or where you work. What tests do I need? Screening Your health  care provider may recommend screening tests for certain conditions. This may include: Lipid and cholesterol levels. Diabetes screening. This is done by checking your blood sugar (glucose) after you have not eaten for a while (fasting). Pelvic exam and Pap test. Hepatitis B test. Hepatitis C test. HIV (human immunodeficiency virus) test. STI (sexually transmitted infection) testing, if you are at risk. Lung cancer screening. Colorectal cancer screening. Mammogram. Talk with your health care provider about when you should start having regular mammograms. This may depend on whether you have a family history of breast cancer. BRCA-related cancer screening. This may be done if you have a family history of breast, ovarian, tubal, or peritoneal cancers. Bone density scan. This is done to screen for osteoporosis. Talk with your health care provider about your test results, treatment options, and if necessary, the need for more tests. Follow these instructions at home: Eating and drinking  Eat a diet that includes fresh fruits and vegetables, whole grains, lean protein, and low-fat dairy products. Take vitamin and mineral supplements as recommended by your health care provider. Do not drink alcohol if: Your health care provider tells you not to drink. You are pregnant, may be pregnant, or are planning to become pregnant. If you drink alcohol: Limit how much you have to 0-1 drink a day. Know how much alcohol is in your drink. In the [place] one drink equals one 12 oz bottle of beer (355 mL), one 5 oz glass of wine (148 mL), or one 1 oz glass of hard liquor (44 mL). Lifestyle Brush your teeth every morning and night with fluoride toothpaste. Floss one time each day. Exercise for at least 30 minutes 5  or more days each week. Do not use any products that contain nicotine or tobacco. These products include cigarettes, chewing tobacco, and vaping devices, such as e-cigarettes. If you need help quitting,  ask your health care provider. Do not use drugs. If you are sexually active, practice safe sex. Use a condom or other form of protection to prevent STIs. If you do not wish to become pregnant, use a form of birth control. If you plan to become pregnant, see your health care provider for a prepregnancy visit. Take aspirin only as told by your health care provider. Make sure that you understand how much to take and what form to take. Work with your health care provider to find out whether it is safe and beneficial for you to take aspirin daily. Find healthy ways to manage stress, such as: Meditation, yoga, or listening to music. Journaling. Talking to a trusted person. Spending time with friends and family. Minimize exposure to UV radiation to reduce your risk of skin cancer. Safety Always wear your seat belt while driving or riding in a vehicle. Do not drive: If you have been drinking alcohol. Do not ride with someone who has been drinking. When you are tired or distracted. While texting. If you have been using any mind-altering substances or drugs. Wear a helmet and other protective equipment during sports activities. If you have firearms in your house, make sure you follow all gun safety procedures. Seek help if you have been physically or sexually abused. What's next? Visit your health care provider once a year for an annual wellness visit. Ask your health care provider how often you should have your eyes and teeth checked. Stay up to date on all vaccines. This information is not intended to replace advice given to you by your health care provider. Make sure you discuss any questions you have with your health care provider. Document Revised: 01/01/2021 Document Reviewed: 01/01/2021 Elsevier Patient Education  [place].

## 2022-02-18 NOTE — Assessment & Plan Note (Signed)
Reviewed labs from October 2022, following with rheumatology.

## 2022-02-18 NOTE — Assessment & Plan Note (Signed)
Immunizations UTD. Mammogram UTD, due next month, orders placed. Pap smear UTD. Colonoscopy UTD, due 2027  Discussed the importance of a healthy diet and regular exercise in order for weight loss, and to reduce the risk of further co-morbidity.  Exam as noted Labs pending.  Follow up in 1 year for repeat physical.

## 2022-02-18 NOTE — Assessment & Plan Note (Signed)
Uncontrolled.   Following with rheumatology, office notes reviewed from April 2023.  Trial of Cymbalta 20 mg daily sent to pharmacy. Discussed common side effects and instructions for use.  She will update in 1 month. Consider dose increase if she's noticing improvement.

## 2022-02-19 LAB — HEPATITIS C ANTIBODY: Hepatitis C Ab: NONREACTIVE

## 2022-02-26 ENCOUNTER — Encounter: Payer: Self-pay | Admitting: Family Medicine

## 2022-02-26 ENCOUNTER — Ambulatory Visit: Payer: No Typology Code available for payment source | Admitting: Family Medicine

## 2022-02-26 VITALS — BP 100/80 | HR 77 | Temp 98.7°F | Ht 67.0 in | Wt 183.4 lb

## 2022-02-26 DIAGNOSIS — M353 Polymyalgia rheumatica: Secondary | ICD-10-CM | POA: Diagnosis not present

## 2022-02-26 DIAGNOSIS — M25512 Pain in left shoulder: Secondary | ICD-10-CM

## 2022-02-26 MED ORDER — TRIAMCINOLONE ACETONIDE 40 MG/ML IJ SUSP
40.0000 mg | Freq: Once | INTRAMUSCULAR | Status: AC
  Administered 2022-02-26: 40 mg via INTRA_ARTICULAR

## 2022-02-26 NOTE — Progress Notes (Signed)
Brianna Tanner T. Ebbie Cherry, MD, [place] at [facility] [place], [address]  Phone: [phone]  FAX: [place] - 61 y.o. female  MRN [id number]  Date of Birth: 03-Oct-1960  Date: 02/26/2022  PCP: Pleas Koch, NP  Referral: Pleas Koch, NP  Chief Complaint  Patient presents with   Shoulder Pain    Left-Wants Injection   Subjective:   Brianna Tanner is a 61 y.o. very pleasant female patient with Body mass index is 28.72 kg/m. who presents with the following:  Shoulder pain: She does have a history of polymyalgia rheumatica previously treated with prednisone by rheumatology.  On Cymbalta, for pain and on supplements.   They seem to be helping with PMR.  Right now, her shoulders particularly flared up on the left side.  She is having pain with motion, she is already doing some range of motion protocol that she got from physical therapy after she hurt her right shoulder in the past.  She is doing wall walks as well as using some assisted range of motion in the bed with her other arm and by gravity.  Shoulder range of motion, while painful is at this point full.  She denies any trauma or acute injury with the left shoulder.  Review of Systems is noted in the HPI, as appropriate  Objective:   BP 100/80   Pulse 77   Temp 98.7 F (37.1 C) (Oral)   Ht '5\' 7"'$  (1.702 m)   Wt 183 lb 6 oz (83.2 kg)   SpO2 95%   BMI 28.72 kg/m   GEN: No acute distress; alert,appropriate. PULM: Breathing comfortably in no respiratory distress PSYCH: Normally interactive.   Left shoulder: Nontender along the clavicle, with mild tenderness at the [facility] joint and in the bicipital groove Tentative range of motion is full and full range of motion is achieved after anesthesia. Abduction: Strength 4/5 External rotation strength 4/5 Internal range of motion strength 5/5 Pain with Michel Bickers,  Neer, and Jobe testing.  Laboratory and Imaging Data:  Assessment and Plan:     ICD-10-CM   1. Acute pain of left shoulder  M25.512     2. Polymyalgia rheumatica (HCC)  M35.3      Acute on chronic shoulder pain with exacerbation in setting of known polymyalgia rheumatica.  Currently off oral steroids, and I think is reasonable to do a intra-articular injection to see if she can get her overall function and pain level under control.  She is already doing a great job with rehab at home.  Intraarticular Shoulder Aspiration/Injection Procedure Note Brianna Tanner Dec 07, 1960 Date of procedure: 02/26/2022  Procedure: Large Joint Aspiration / Injection of Shoulder, Intraarticular, L Indications: Pain  Procedure Details Verbal consent was obtained from the patient. Risks explained and contrasted with benefits and alternatives. Patient prepped with Chloraprep and Ethyl Chloride used for anesthesia. An intraarticular shoulder injection was performed using the posterior approach; needle placed into joint capsule without difficulty. The patient tolerated the procedure well and had decreased pain post injection. No complications. Injection: 9 cc of Lidocaine 1% and 1 mL Kenalog 40 mg. Needle: 21 gauge, 2 inch   Dragon [employer] One speech-to-text software was used for transcription in this dictation.  Possible transcriptional errors can occur using Editor, commissioning.   Signed,  Maud Deed. Elice Crigger, MD   Outpatient Encounter Medications as of 02/26/2022  Medication Sig   acetaminophen (TYLENOL) 650  MG CR tablet Take 650-1,300 mg by mouth every 8 (eight) hours as needed for pain.   DULoxetine (CYMBALTA) 20 MG capsule Take 1 capsule (20 mg total) by mouth daily. For pain.   fluticasone (FLONASE) 50 MCG/ACT nasal spray SMARTSIG:Both Nares Once a Week   levocetirizine (XYZAL) 5 MG tablet SMARTSIG:1 Tablet(s) By Mouth   No facility-administered encounter medications on file as of 02/26/2022.

## 2022-02-26 NOTE — Addendum Note (Signed)
Addended by: Carter Kitten on: 02/26/2022 09:34 AM   Modules accepted: Orders

## 2022-03-24 NOTE — Telephone Encounter (Signed)
Have sent message to let patient know Anda Kraft is out of office and to call if urgent matter that needs to be addressed.

## 2022-04-03 DIAGNOSIS — M353 Polymyalgia rheumatica: Secondary | ICD-10-CM

## 2022-04-07 MED ORDER — DULOXETINE HCL 30 MG PO CPEP
30.0000 mg | ORAL_CAPSULE | Freq: Every day | ORAL | 0 refills | Status: DC
Start: 2022-04-07 — End: 2022-04-30

## 2022-04-30 ENCOUNTER — Ambulatory Visit: Payer: No Typology Code available for payment source | Admitting: Primary Care

## 2022-04-30 ENCOUNTER — Encounter: Payer: Self-pay | Admitting: Primary Care

## 2022-04-30 DIAGNOSIS — M353 Polymyalgia rheumatica: Secondary | ICD-10-CM | POA: Diagnosis not present

## 2022-04-30 NOTE — Assessment & Plan Note (Signed)
Improved with Cymbalta, but there is certainly room for continued improvement.   Long discussion today regarding options. We decided to increase her Cymbalta dose to 20 mg BID. Consider increasing to 20 mg in AM and 30 mg in PM vs adding gabapentin HS.  She will update in a few weeks.
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
## 2022-04-30 NOTE — Patient Instructions (Signed)
Start taking duloxetine (Cymbalta) 20 mg twice daily for pain.  Please update me again in a few weeks.  It was a pleasure to see you today!

## 2022-05-16 ENCOUNTER — Other Ambulatory Visit: Payer: Self-pay | Admitting: Primary Care

## 2022-05-16 DIAGNOSIS — M353 Polymyalgia rheumatica: Secondary | ICD-10-CM

## 2022-05-25 ENCOUNTER — Ambulatory Visit
Admission: RE | Admit: 2022-05-25 | Discharge: 2022-05-25 | Disposition: A | Discharge status: Home / Self Care | Payer: No Typology Code available for payment source | Source: Ambulatory Visit | Attending: Primary Care | Admitting: Primary Care

## 2022-05-25 DIAGNOSIS — Z1231 Encounter for screening mammogram for malignant neoplasm of breast: Secondary | ICD-10-CM | POA: Insufficient documentation

## 2022-06-28 ENCOUNTER — Other Ambulatory Visit: Payer: Self-pay | Admitting: Primary Care

## 2022-06-28 DIAGNOSIS — M353 Polymyalgia rheumatica: Secondary | ICD-10-CM

## 2022-08-06 ENCOUNTER — Other Ambulatory Visit: Payer: Self-pay | Admitting: Primary Care

## 2022-08-06 DIAGNOSIS — M353 Polymyalgia rheumatica: Secondary | ICD-10-CM

## 2022-08-06 MED ORDER — DULOXETINE HCL 20 MG PO CPEP: 20.0000 mg | ORAL_CAPSULE | Freq: Two times a day (BID) | ORAL | 1 refills | Status: DC

## 2022-08-06 NOTE — Telephone Encounter (Signed)
From: Garnette Scheuermann To: Office of Pleas Koch, NP Sent: 08/05/2022 10:42 PM EST Subject: Medication Renewal Request  Refills have been requested for the following medications:   DULoxetine (CYMBALTA) 20 MG capsule [Torin Whisner K Chamberlain Steinborn]  [place]erred [id number] [address] Delivery method: [employer]

## 2022-10-06 DIAGNOSIS — Z85820 Personal history of malignant melanoma of skin: Secondary | ICD-10-CM | POA: Diagnosis not present

## 2022-10-06 DIAGNOSIS — L570 Actinic keratosis: Secondary | ICD-10-CM | POA: Diagnosis not present

## 2022-10-06 DIAGNOSIS — D485 Neoplasm of uncertain behavior of skin: Secondary | ICD-10-CM | POA: Diagnosis not present

## 2022-10-06 DIAGNOSIS — D2361 Other benign neoplasm of skin of right upper limb, including shoulder: Secondary | ICD-10-CM | POA: Diagnosis not present

## 2022-10-06 DIAGNOSIS — D225 Melanocytic nevi of trunk: Secondary | ICD-10-CM | POA: Diagnosis not present

## 2022-10-06 DIAGNOSIS — D2262 Melanocytic nevi of left upper limb, including shoulder: Secondary | ICD-10-CM | POA: Diagnosis not present

## 2022-10-09 ENCOUNTER — Emergency Department
Admission: EM | Admit: 2022-10-09 | Discharge: 2022-10-09 | Disposition: A | Discharge status: Home / Self Care | Payer: BLUE CROSS/BLUE SHIELD | Attending: Emergency Medicine | Admitting: Emergency Medicine

## 2022-10-09 ENCOUNTER — Other Ambulatory Visit: Payer: Self-pay

## 2022-10-09 ENCOUNTER — Emergency Department: Payer: BLUE CROSS/BLUE SHIELD

## 2022-10-09 DIAGNOSIS — N132 Hydronephrosis with renal and ureteral calculous obstruction: Secondary | ICD-10-CM | POA: Insufficient documentation

## 2022-10-09 DIAGNOSIS — K5730 Diverticulosis of large intestine without perforation or abscess without bleeding: Secondary | ICD-10-CM | POA: Diagnosis not present

## 2022-10-09 DIAGNOSIS — N200 Calculus of kidney: Secondary | ICD-10-CM | POA: Diagnosis not present

## 2022-10-09 DIAGNOSIS — R109 Unspecified abdominal pain: Secondary | ICD-10-CM | POA: Diagnosis not present

## 2022-10-09 LAB — URINALYSIS, ROUTINE W REFLEX MICROSCOPIC
Bilirubin Urine: NEGATIVE
Glucose, UA: NEGATIVE mg/dL
Ketones, ur: NEGATIVE mg/dL
Leukocytes,Ua: NEGATIVE
Nitrite: NEGATIVE
Protein, ur: 30 mg/dL — AB
RBC / HPF: >50 RBC/hpf (ref 0–5)
Specific Gravity, Urine: 1.01 (ref 1.005–1.030)
Squamous Epithelial / HPF: NONE SEEN /HPF (ref 0–5)
pH: 9 — ABNORMAL HIGH (ref 5.0–8.0)

## 2022-10-09 LAB — COMPREHENSIVE METABOLIC PANEL
ALT: 22 U/L (ref 0–44)
AST: 26 U/L (ref 15–41)
Albumin: 4.3 g/dL (ref 3.5–5.0)
Alkaline Phosphatase: 43 U/L (ref 38–126)
Anion gap: 10 (ref 5–15)
BUN: 14 mg/dL (ref 8–23)
CO2: 26 mmol/L (ref 22–32)
Calcium: 8.9 mg/dL (ref 8.9–10.3)
Chloride: 101 mmol/L (ref 98–111)
Creatinine, Ser: 0.73 mg/dL (ref 0.44–1.00)
GFR, Estimated: >60 mL/min (ref >60)
Glucose, Bld: 151 mg/dL — ABNORMAL HIGH (ref 70–99)
Potassium: 3.1 mmol/L — ABNORMAL LOW (ref 3.5–5.1)
Sodium: 137 mmol/L (ref 135–145)
Total Bilirubin: 0.6 mg/dL (ref 0.3–1.2)
Total Protein: 7.1 g/dL (ref 6.5–8.1)

## 2022-10-09 LAB — CBC WITH DIFFERENTIAL/PLATELET
Abs Immature Granulocytes: 0.02 10*3/uL (ref 0.00–0.07)
Basophils Absolute: 0 10*3/uL (ref 0.0–0.1)
Basophils Relative: 0 %
Eosinophils Absolute: 0.1 10*3/uL (ref 0.0–0.5)
Eosinophils Relative: 1 %
HCT: 39.4 % (ref 36.0–46.0)
Hemoglobin: 13.5 g/dL (ref 12.0–15.0)
Immature Granulocytes: 0 %
Lymphocytes Relative: 23 %
Lymphs Abs: 1.3 10*3/uL (ref 0.7–4.0)
MCH: 30.3 pg (ref 26.0–34.0)
MCHC: 34.3 g/dL (ref 30.0–36.0)
MCV: 88.5 fL (ref 80.0–100.0)
Monocytes Absolute: 0.4 10*3/uL (ref 0.1–1.0)
Monocytes Relative: 6 %
Neutro Abs: 3.9 10*3/uL (ref 1.7–7.7)
Neutrophils Relative %: 70 %
Platelets: 231 10*3/uL (ref 150–400)
RBC: 4.45 MIL/uL (ref 3.87–5.11)
RDW: 12.4 % (ref 11.5–15.5)
WBC: 5.6 10*3/uL (ref 4.0–10.5)
nRBC: 0 % (ref 0.0–0.2)

## 2022-10-09 LAB — LIPASE, BLOOD: Lipase: 33 U/L (ref 11–51)

## 2022-10-09 MED ORDER — SODIUM CHLORIDE 0.9 % IV BOLUS
1000.0000 mL | Freq: Once | INTRAVENOUS | Status: AC
  Administered 2022-10-09: 1000 mL via INTRAVENOUS

## 2022-10-09 MED ORDER — OXYCODONE-ACETAMINOPHEN 5-325 MG PO TABS: 1.0000 | ORAL_TABLET | ORAL | 0 refills | Status: DC | PRN

## 2022-10-09 MED ORDER — TAMSULOSIN HCL 0.4 MG PO CAPS: 0.4000 mg | ORAL_CAPSULE | Freq: Every day | ORAL | 0 refills | Status: DC

## 2022-10-09 MED ORDER — FENTANYL CITRATE PF 50 MCG/ML IJ SOSY
50.0000 ug | PREFILLED_SYRINGE | Freq: Once | INTRAMUSCULAR | Status: AC
  Administered 2022-10-09: 50 ug via INTRAVENOUS
  Filled 2022-10-09: qty 1

## 2022-10-09 MED ORDER — ONDANSETRON 4 MG PO TBDP: 4.0000 mg | ORAL_TABLET | Freq: Three times a day (TID) | ORAL | 0 refills | Status: DC | PRN

## 2022-10-09 MED ORDER — ONDANSETRON HCL 4 MG/2ML IJ SOLN
4.0000 mg | Freq: Once | INTRAMUSCULAR | Status: AC
  Administered 2022-10-09: 4 mg via INTRAVENOUS
  Filled 2022-10-09: qty 2

## 2022-10-09 MED ORDER — KETOROLAC TROMETHAMINE 30 MG/ML IJ SOLN
15.0000 mg | Freq: Once | INTRAMUSCULAR | Status: AC
  Administered 2022-10-09: 15 mg via INTRAVENOUS
  Filled 2022-10-09: qty 1

## 2022-10-09 NOTE — Discharge Instructions (Signed)
Please take your pain medication as needed nausea medicine as needed.  Please take your Flomax each morning and drink plenty of fluids to help promote stone passage.  Please call the number provided for urology to arrange a follow-up appointment.  Return to the emergency department for any worsening pain any fever or any other symptom personally concerning to yourself.

## 2022-10-09 NOTE — ED Triage Notes (Signed)
Pt states she is having right flank pain that started earlier today. Denies any n/v or painful urination.

## 2022-10-09 NOTE — ED Provider Notes (Signed)
[facility] Provider Note    Event Date/Time   First MD Initiated Contact with Patient 10/09/22 1854     (approximate)  History   Chief Complaint: Flank Pain  HPI  Lissy Raelan Teruel is a 62 y.o. female with a past medical history of hyperlipidemia, prior kidney stone, presents emergency department for right flank pain.  According to the patient since earlier today she has been experiencing sharp pain in the right flank, states she noticed some blood in her urine this evening.  No dysuria.  No fever.  States nausea and vomiting at home.  No diarrhea.  Physical Exam   Triage Vital Signs: ED Triage Vitals  Enc Vitals Group     BP 10/09/22 1757 (!) 165/83     Pulse Rate 10/09/22 1757 70     Resp 10/09/22 1757 18     Temp 10/09/22 1757 98.1 F (36.7 C)     Temp Source 10/09/22 1757 Oral     SpO2 10/09/22 1757 93 %     Weight 10/09/22 1757 180 lb (81.6 kg)     Height --      Head Circumference --      Peak Flow --      Pain Score 10/09/22 1756 (S) 4     Pain Loc --      Pain Edu? --      Excl. in [place]? --     Most recent vital signs: Vitals:   10/09/22 1757 10/09/22 1850  BP: (!) 165/83 (!) 177/89  Pulse: 70 75  Resp: 18   Temp: 98.1 F (36.7 C)   SpO2: 93%     General: Awake, no distress.  CV:  Good peripheral perfusion.  Regular rate and rhythm  Resp:  Normal effort.  Equal breath sounds bilaterally.  Abd:  No distention.  Soft, mild right-sided tenderness.  No rebound or guarding.  ED Results / Procedures / Treatments   RADIOLOGY  I have reviewed and interpreted the CT images.  Patient appears to have a possible right-sided UVJ stone.  Awaiting radiology read. Radiology has read the CT scan as mild to moderate right hydronephrosis 6 mm calculus in the distal right ureter at the UVJ.   MEDICATIONS ORDERED IN ED: Medications  sodium chloride 0.9 % bolus 1,000 mL (1,000 mLs Intravenous New Bag/Given 10/09/22 1938)  fentaNYL  (SUBLIMAZE) injection 50 mcg (50 mcg Intravenous Given 10/09/22 1931)  ondansetron (ZOFRAN) injection 4 mg (4 mg Intravenous Given 10/09/22 1932)     IMPRESSION / MDM / ASSESSMENT AND PLAN / ED COURSE  I reviewed the triage vital signs and the nursing notes.  Patient's presentation is most consistent with acute presentation with potential threat to life or bodily function.  Patient presents emergency department for right flank pain since earlier today along with nausea and vomiting as well as hematuria.  History of 1 prior kidney stone that did require lithotripsy per patient.  Here the patient appears well, no distress states mild to moderate pain currently.  We will treat the patient's pain, IV hydrate and treat her nausea.  Urinalysis does show hematuria but no white cells or signs of active infection.  CBC is normal with a normal white blood cell count, chemistry is reassuring with normal renal function, lipase is normal. CT scan has resulted showing a 6 mm right UVJ stone likely the cause of the patient's symptoms.  We will dose Toradol and reassess.  Patient is feeling much better  after medication.  Will discharge the patient on Percocet to be used as needed, urology follow-up and Zofran if needed for nausea as well as Flomax.  Discussed my typical kidney stone return precautions.  FINAL CLINICAL IMPRESSION(S) / ED DIAGNOSES   Ureterolithiasis   Note:  This document was prepared using Dragon voice recognition software and may include unintentional dictation errors.   Harvest Dark, MD 10/09/22 2119

## 2022-10-11 LAB — URINE CULTURE

## 2022-10-12 ENCOUNTER — Telehealth: Payer: Self-pay

## 2022-10-12 NOTE — Transitions of Care (Post Inpatient/ED Visit) (Signed)
I have set up follow up with patient she is working on getting a appointment with urology. Patient has sent my chart with other information for PCP to review.     10/12/2022  Name: Brianna Tanner MRN: [id number] DOB: [birth year]  Today's TOC FU Call Status: Today's TOC FU Call Status:: Successful TOC FU Call Competed TOC FU Call Complete Date: 10/12/22  Transition Care Management Follow-up Telephone Call Date of Discharge: 10/09/22 Discharge Facility[facility] Type of Discharge: Emergency Department Reason for ED Visit: Renal (Kidney stones) How have you been since you were released from the hospital?: Better Any questions or concerns?: Yes Patient Questions/Concerns:: Working on getting see at urology. and app with eye Dr. Patient Questions/Concerns Addressed: Notified Provider of Patient Questions/Concerns  Items Reviewed: Did you receive and understand the discharge instructions provided?: Yes Medications obtained and verified?: Yes (Medications Reviewed) Any new allergies since your discharge?: No Dietary orders reviewed?: NA Do you have support at home?: Yes People in Home: spouse Name of Support/Comfort Primary Source: [facility] and Equipment/Supplies: [place] Ordered?: NA Any new equipment or medical supplies ordered?: NA  Functional Questionnaire: Do you need assistance with bathing/showering or dressing?: No Do you need assistance with meal preparation?: No Do you need assistance with eating?: No Do you have difficulty maintaining continence: No Do you need assistance with getting out of bed/getting out of a chair/moving?: No Do you have difficulty managing or taking your medications?: No  Follow up appointments reviewed: PCP Follow-up appointment confirmed?: Yes Date of PCP follow-up appointment?: 10/15/22 Follow-up Provider: Allie Bossier NP [facility] Follow-up appointment confirmed?:  No Reason Specialist Follow-Up Not Confirmed: Patient has Specialist Provider Number and will Call for Appointment Do you need transportation to your follow-up appointment?: No Do you understand care options if your condition(s) worsen?: Yes-patient verbalized understanding    [place], CMA

## 2022-10-13 DIAGNOSIS — Q159 Congenital malformation of eye, unspecified: Secondary | ICD-10-CM

## 2022-10-15 ENCOUNTER — Ambulatory Visit: Payer: BLUE CROSS/BLUE SHIELD | Admitting: Primary Care

## 2022-10-15 ENCOUNTER — Encounter: Payer: Self-pay | Admitting: Primary Care

## 2022-10-15 VITALS — BP 122/84 | HR 76 | Temp 97.5°F | Ht 67.0 in | Wt 184.0 lb

## 2022-10-15 DIAGNOSIS — N200 Calculus of kidney: Secondary | ICD-10-CM

## 2022-10-15 DIAGNOSIS — E876 Hypokalemia: Secondary | ICD-10-CM

## 2022-10-15 HISTORY — DX: Calculus of kidney: N20.0

## 2022-10-15 LAB — POTASSIUM: Potassium: 4 mEq/L (ref 3.5–5.1)
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
## 2022-10-15 NOTE — Patient Instructions (Addendum)
Stop by the lab prior to leaving today. I will notify you of your results once received.   Please schedule a physical to meet with me in August.   It was a pleasure to see you today!

## 2022-10-15 NOTE — Assessment & Plan Note (Signed)
Recent ED visit. ED notes, labs, imaging reviewed.  Recheck potassium level today. Follow up with Urology as scheduled, especially given multiple bilateral renal stones with one measuring 10 mm.   Follow up with PCP as needed.

## 2022-10-29 ENCOUNTER — Ambulatory Visit: Payer: BLUE CROSS/BLUE SHIELD | Admitting: Urology

## 2022-10-29 ENCOUNTER — Encounter: Payer: Self-pay | Admitting: Urology

## 2022-10-29 VITALS — BP 117/76 | HR 84 | Ht 67.0 in | Wt 185.0 lb

## 2022-10-29 DIAGNOSIS — N201 Calculus of ureter: Secondary | ICD-10-CM

## 2022-10-29 DIAGNOSIS — N200 Calculus of kidney: Secondary | ICD-10-CM | POA: Diagnosis not present

## 2022-10-29 LAB — URINALYSIS, COMPLETE
Bilirubin, UA: NEGATIVE
Glucose, UA: NEGATIVE
Leukocytes,UA: NEGATIVE
Nitrite, UA: NEGATIVE
RBC, UA: NEGATIVE
Specific Gravity, UA: 1.025 (ref 1.005–1.030)
Urobilinogen, Ur: 0.2 mg/dL (ref 0.2–1.0)
pH, UA: 5 (ref 5.0–7.5)

## 2022-10-29 LAB — MICROSCOPIC EXAMINATION
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
## 2022-11-05 ENCOUNTER — Encounter: Payer: Self-pay | Admitting: Urology

## 2022-11-05 LAB — CALCULI, WITH PHOTOGRAPH (CLINICAL LAB)
Calcium Oxalate Dihydrate: 80 %
Calcium Oxalate Monohydrate: 10 %
Hydroxyapatite: 10 %
Weight Calculi: 32 mg

## 2022-11-06 ENCOUNTER — Other Ambulatory Visit: Payer: Self-pay | Admitting: Urology

## 2022-11-06 DIAGNOSIS — N200 Calculus of kidney: Secondary | ICD-10-CM

## 2022-11-06 NOTE — Progress Notes (Signed)
ESWL ORDER FORM  Expected date of procedure: Patient preference  Surgeon: TBD  Post op standing: 2-4wk follow up w/KUB prior  Anticoagulation/Aspirin/NSAID standing order: Hold all 72 hours prior  Anesthesia standing order: MAC  VTE standing: SCD's  Dx: Left Nephrolithiasis  Procedure: left Extracorporeal shock wave lithotripsy  CPT : 50590  Standing Order Set:   *NPO after mn, KUB  *NS 180ml/hr, Keflex  PO, Benadryl  PO, Valium  PO, Zofran  IV    Medications if other than standing orders:   NONE

## 2022-11-11 ENCOUNTER — Encounter: Payer: Self-pay | Admitting: Urology

## 2022-11-11 NOTE — Addendum Note (Signed)
Addended by: Letta Kocher A on: 11/11/2022 09:55 AM   Modules accepted: Orders

## 2022-11-12 ENCOUNTER — Encounter: Admission: RE | Payer: Self-pay | Source: Home / Self Care

## 2022-11-12 ENCOUNTER — Ambulatory Visit: Admission: RE | Admit: 2022-11-12 | Payer: BLUE CROSS/BLUE SHIELD | Source: Home / Self Care | Admitting: Urology

## 2022-11-12 SURGERY — LITHOTRIPSY, ESWL
Anesthesia: Moderate Sedation | Laterality: Left

## 2022-12-04 DIAGNOSIS — Z01810 Encounter for preprocedural cardiovascular examination: Secondary | ICD-10-CM | POA: Diagnosis not present

## 2022-12-04 DIAGNOSIS — I4510 Unspecified right bundle-branch block: Secondary | ICD-10-CM | POA: Diagnosis not present

## 2022-12-04 DIAGNOSIS — N200 Calculus of kidney: Secondary | ICD-10-CM | POA: Diagnosis not present

## 2022-12-04 DIAGNOSIS — R9431 Abnormal electrocardiogram [ECG] [EKG]: Secondary | ICD-10-CM | POA: Diagnosis not present

## 2022-12-09 DIAGNOSIS — R9431 Abnormal electrocardiogram [ECG] [EKG]: Secondary | ICD-10-CM

## 2022-12-09 DIAGNOSIS — I4510 Unspecified right bundle-branch block: Secondary | ICD-10-CM

## 2022-12-15 ENCOUNTER — Telehealth: Payer: Self-pay | Admitting: Primary Care

## 2022-12-15 DIAGNOSIS — N200 Calculus of kidney: Secondary | ICD-10-CM

## 2022-12-15 NOTE — Telephone Encounter (Signed)
Noted. Will place referral.  

## 2022-12-15 NOTE — Telephone Encounter (Signed)
Unable to reach patient. Left voicemail to return call to our office.   

## 2022-12-15 NOTE — Telephone Encounter (Signed)
Just to clarify, she is needing a referral to "general surgery" at [facility] surgical clinic so that she can proceed with her scheduled lithotripsy?  Did someone order the lithotripsy?  This should have come from her urologist which is why I am asking.

## 2022-12-15 NOTE — Telephone Encounter (Signed)
Patient contacted the office regarding a referral she will need sent to [facility]. States she is scheduled for a lithotripsy, office is needing a referral from her pcp to be faxed over in order to do this procedure. Fax umber is [phone], please advise.

## 2022-12-15 NOTE — Telephone Encounter (Signed)
Patient called back in she states yes she needs the referral placed to general surgery at [facility]. The surgery is already scheduled. She stated due to the surgical center not being attached to the hospital they need a separate referral for procedure. She states she does see Urology and they are the ones who has ordered the procedure, however when she spoke with her insurance they require the referral to come from her PCP not urology.

## 2022-12-22 DIAGNOSIS — N200 Calculus of kidney: Secondary | ICD-10-CM | POA: Diagnosis not present

## 2023-01-29 ENCOUNTER — Other Ambulatory Visit: Payer: Self-pay | Admitting: Primary Care

## 2023-01-29 DIAGNOSIS — M353 Polymyalgia rheumatica: Secondary | ICD-10-CM

## 2023-01-29 NOTE — Telephone Encounter (Signed)
Called patient to set up appointment. Patient stated she is at work and will contact the office to set this appointment up soon.

## 2023-01-29 NOTE — Telephone Encounter (Signed)
Patient is due for CPE/follow up in August, this will be required prior to any further refills.  Please schedule, thank you!   

## 2023-02-04 NOTE — Telephone Encounter (Signed)
Lvm for patient tcb and schedule 

## 2023-02-05 ENCOUNTER — Ambulatory Visit: Payer: BLUE CROSS/BLUE SHIELD | Admitting: Cardiology

## 2023-02-18 ENCOUNTER — Ambulatory Visit: Payer: BLUE CROSS/BLUE SHIELD | Attending: Cardiology | Admitting: Cardiology

## 2023-02-18 ENCOUNTER — Encounter: Payer: Self-pay | Admitting: Cardiology

## 2023-02-18 VITALS — BP 136/80 | HR 68 | Ht 67.0 in | Wt 184.4 lb

## 2023-02-18 DIAGNOSIS — R9431 Abnormal electrocardiogram [ECG] [EKG]: Secondary | ICD-10-CM

## 2023-02-18 DIAGNOSIS — Z8679 Personal history of other diseases of the circulatory system: Secondary | ICD-10-CM | POA: Diagnosis not present
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
## 2023-02-18 NOTE — Patient Instructions (Signed)
Medication Instructions:   Your physician recommends that you continue on your current medications as directed. Please refer to the Current Medication list given to you today.  *If you need a refill on your cardiac medications before your next appointment, please call your pharmacy*   Lab Work:  None Ordered  If you have labs (blood work) drawn today and your tests are completely normal, you will receive your results only by: MyChart Message (if you have MyChart) OR A paper copy in the mail If you have any lab test that is abnormal or we need to change your treatment, we will call you to review the results.   Testing/Procedures:  Your physician has requested that you have an echocardiogram. Echocardiography is a painless test that uses sound waves to create images of your heart. It provides your doctor with information about the size and shape of your heart and how well your heart's chambers and valves are working. This procedure takes approximately one hour. There are no restrictions for this procedure. Please do NOT wear cologne, perfume, aftershave, or lotions (deodorant is allowed). Please arrive 15 minutes prior to your appointment time.    Follow-Up: At [facility], you and your health needs are our priority.  As part of our continuing mission to provide you with exceptional heart care, we have created designated Provider Care Teams.  These Care Teams include your primary Cardiologist (physician) and Advanced Practice Providers (APPs -  Physician Assistants and Nurse Practitioners) who all work together to provide you with the care you need, when you need it.  We recommend signing up for the patient portal called "MyChart".  Sign up information is provided on this After Visit Summary.  MyChart is used to connect with patients for Virtual Visits (Telemedicine).  Patients are able to view lab/test results, encounter notes, upcoming appointments, etc.  Non-urgent messages can  be sent to your provider as well.   To learn more about what you can do with MyChart, go to [email or web address].    Your next appointment:    After Echocardiogram  Provider:   You may see Brian Agbor-Etang, MD or one of the following Advanced Practice Providers on your designated Care Team:   Christopher Berge, NP Ryan Dunn, PA-C Cadence Furth, PA-C Sheri Hammock, NP 

## 2023-03-16 ENCOUNTER — Other Ambulatory Visit: Payer: Self-pay | Admitting: Cardiology

## 2023-03-16 DIAGNOSIS — Z8679 Personal history of other diseases of the circulatory system: Secondary | ICD-10-CM

## 2023-03-16 DIAGNOSIS — R9431 Abnormal electrocardiogram [ECG] [EKG]: Secondary | ICD-10-CM

## 2023-03-16 DIAGNOSIS — I4510 Unspecified right bundle-branch block: Secondary | ICD-10-CM

## 2023-03-23 ENCOUNTER — Ambulatory Visit: Payer: BLUE CROSS/BLUE SHIELD | Attending: Cardiology

## 2023-03-23 DIAGNOSIS — I4510 Unspecified right bundle-branch block: Secondary | ICD-10-CM | POA: Diagnosis not present

## 2023-03-23 DIAGNOSIS — R9431 Abnormal electrocardiogram [ECG] [EKG]: Secondary | ICD-10-CM

## 2023-03-23 DIAGNOSIS — Z8679 Personal history of other diseases of the circulatory system: Secondary | ICD-10-CM

## 2023-03-24 LAB — ECHOCARDIOGRAM COMPLETE
AR max vel: 2.47 cm2
AV Peak grad: 5.6 mmHg
Ao pk vel: 1.18 m/s
Area-P 1/2: 2.76 cm2
Calc EF: 71.2 %
S' Lateral: 2.7 cm
Single Plane A2C EF: 69.7 %
Single Plane A4C EF: 70.8 %

## 2023-03-29 ENCOUNTER — Ambulatory Visit: Payer: BLUE CROSS/BLUE SHIELD | Admitting: Physician Assistant

## 2023-04-25 ENCOUNTER — Other Ambulatory Visit: Payer: Self-pay | Admitting: Primary Care

## 2023-04-25 DIAGNOSIS — M353 Polymyalgia rheumatica: Secondary | ICD-10-CM

## 2023-04-25 NOTE — Telephone Encounter (Signed)
Patient is due for CPE/follow up, this will be required prior to any further refills.  Please schedule, thank you!   

## 2023-04-26 NOTE — Telephone Encounter (Signed)
Spoke to pt, pt states she'll call office back to schedule

## 2023-04-30 DIAGNOSIS — N200 Calculus of kidney: Secondary | ICD-10-CM | POA: Diagnosis not present

## 2023-06-07 DIAGNOSIS — Q159 Congenital malformation of eye, unspecified: Secondary | ICD-10-CM

## 2023-07-29 ENCOUNTER — Other Ambulatory Visit: Payer: Self-pay | Admitting: Primary Care

## 2023-07-29 DIAGNOSIS — M353 Polymyalgia rheumatica: Secondary | ICD-10-CM

## 2023-07-29 NOTE — Telephone Encounter (Signed)
 Patient is due for CPE/follow up, this will be required prior to any further refills.  Please schedule, thank you!

## 2023-07-29 NOTE — Telephone Encounter (Signed)
 Spoke to pt, scheduled cpe for 08/12/23

## 2023-08-12 ENCOUNTER — Other Ambulatory Visit (HOSPITAL_COMMUNITY)
Admission: RE | Admit: 2023-08-12 | Discharge: 2023-08-12 | Disposition: A | Discharge status: Home / Self Care | Payer: BLUE CROSS/BLUE SHIELD | Source: Ambulatory Visit | Attending: Primary Care | Admitting: Primary Care

## 2023-08-12 ENCOUNTER — Ambulatory Visit: Payer: BLUE CROSS/BLUE SHIELD | Admitting: Primary Care

## 2023-08-12 ENCOUNTER — Encounter: Payer: Self-pay | Admitting: Primary Care

## 2023-08-12 VITALS — BP 148/88 | HR 75 | Temp 97.8°F | Ht 67.0 in | Wt 193.0 lb

## 2023-08-12 DIAGNOSIS — M353 Polymyalgia rheumatica: Secondary | ICD-10-CM

## 2023-08-12 DIAGNOSIS — Z1231 Encounter for screening mammogram for malignant neoplasm of breast: Secondary | ICD-10-CM

## 2023-08-12 DIAGNOSIS — Z0000 Encounter for general adult medical examination without abnormal findings: Secondary | ICD-10-CM

## 2023-08-12 DIAGNOSIS — Z124 Encounter for screening for malignant neoplasm of cervix: Secondary | ICD-10-CM | POA: Insufficient documentation

## 2023-08-12 DIAGNOSIS — N200 Calculus of kidney: Secondary | ICD-10-CM

## 2023-08-12 DIAGNOSIS — E782 Mixed hyperlipidemia: Secondary | ICD-10-CM

## 2023-08-12 DIAGNOSIS — D72819 Decreased white blood cell count, unspecified: Secondary | ICD-10-CM

## 2023-08-12 LAB — COMPREHENSIVE METABOLIC PANEL
ALT: 23 U/L (ref 0–35)
AST: 20 U/L (ref 0–37)
Albumin: 4.8 g/dL (ref 3.5–5.2)
Alkaline Phosphatase: 49 U/L (ref 39–117)
BUN: 12 mg/dL (ref 6–23)
CO2: 32 meq/L (ref 19–32)
Calcium: 9.1 mg/dL (ref 8.4–10.5)
Chloride: 100 meq/L (ref 96–112)
Creatinine, Ser: 0.61 mg/dL (ref 0.40–1.20)
GFR: 95.77 mL/min (ref >60.00)
Glucose, Bld: 98 mg/dL (ref 70–99)
Potassium: 4.4 meq/L (ref 3.5–5.1)
Sodium: 139 meq/L (ref 135–145)
Total Bilirubin: 0.5 mg/dL (ref 0.2–1.2)
Total Protein: 6.9 g/dL (ref 6.0–8.3)

## 2023-08-12 LAB — LIPID PANEL
Cholesterol: 253 mg/dL — ABNORMAL HIGH (ref 0–200)
HDL: 56 mg/dL (ref >39.00)
LDL Cholesterol: 150 mg/dL — ABNORMAL HIGH (ref 0–99)
NonHDL: 196.58
Total CHOL/HDL Ratio: 5
Triglycerides: 235 mg/dL — ABNORMAL HIGH (ref 0.0–149.0)
VLDL: 47 mg/dL — ABNORMAL HIGH (ref 0.0–40.0)

## 2023-08-12 LAB — HEMOGLOBIN A1C: Hgb A1c MFr Bld: 5.8 % (ref 4.6–6.5)
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
## 2023-08-12 NOTE — Assessment & Plan Note (Signed)
Stable.   Following with Urology in [place].

## 2023-08-12 NOTE — Assessment & Plan Note (Signed)
Immunizations UTD. Declines influenza vaccine.  Pap smear due completed today. Mammogram due, orders placed. Colonoscopy UTD, due 2027  Discussed the importance of a healthy diet and regular exercise in order for weight loss, and to reduce the risk of further co-morbidity.  Exam stable. Labs pending.  Follow up in 1 year for repeat physical.

## 2023-08-12 NOTE — Assessment & Plan Note (Signed)
Evaluated by hematology, determined benign.

## 2023-08-12 NOTE — Assessment & Plan Note (Signed)
Controlled.  Continue duloxetine 20 mg twice daily.

## 2023-08-12 NOTE — Assessment & Plan Note (Signed)
Repeat lipid panel pending.  Discussed the importance of a healthy diet and regular exercise in order for weight loss, and to reduce the risk of further co-morbidity.  

## 2023-08-12 NOTE — Patient Instructions (Signed)
Stop by the lab prior to leaving today. I will notify you of your results once received.   [facility] to schedule your [facility].   It was a pleasure to see you today!   

## 2023-08-16 LAB — CYTOLOGY - PAP
Adequacy: ABSENT
Comment: NEGATIVE
Diagnosis: NEGATIVE
High risk HPV: NEGATIVE

## 2023-08-25 ENCOUNTER — Other Ambulatory Visit: Payer: Self-pay | Admitting: Primary Care

## 2023-08-25 DIAGNOSIS — M353 Polymyalgia rheumatica: Secondary | ICD-10-CM

## 2023-10-11 DIAGNOSIS — L820 Inflamed seborrheic keratosis: Secondary | ICD-10-CM | POA: Diagnosis not present

## 2023-10-11 DIAGNOSIS — Z85820 Personal history of malignant melanoma of skin: Secondary | ICD-10-CM | POA: Diagnosis not present

## 2023-10-11 DIAGNOSIS — D2271 Melanocytic nevi of right lower limb, including hip: Secondary | ICD-10-CM | POA: Diagnosis not present

## 2023-10-11 DIAGNOSIS — D2272 Melanocytic nevi of left lower limb, including hip: Secondary | ICD-10-CM | POA: Diagnosis not present

## 2023-10-11 DIAGNOSIS — D2361 Other benign neoplasm of skin of right upper limb, including shoulder: Secondary | ICD-10-CM | POA: Diagnosis not present

## 2024-02-04 DIAGNOSIS — H2513 Age-related nuclear cataract, bilateral: Secondary | ICD-10-CM | POA: Diagnosis not present

## 2024-02-04 DIAGNOSIS — H43392 Other vitreous opacities, left eye: Secondary | ICD-10-CM | POA: Diagnosis not present

## 2024-02-14 ENCOUNTER — Encounter: Payer: Self-pay | Admitting: Emergency Medicine

## 2024-02-14 ENCOUNTER — Emergency Department
Admission: EM | Admit: 2024-02-14 | Discharge: 2024-02-14 | Disposition: A | Discharge status: Home / Self Care | Attending: Emergency Medicine | Admitting: Emergency Medicine

## 2024-02-14 ENCOUNTER — Emergency Department

## 2024-02-14 ENCOUNTER — Other Ambulatory Visit: Payer: Self-pay

## 2024-02-14 DIAGNOSIS — R079 Chest pain, unspecified: Secondary | ICD-10-CM | POA: Diagnosis not present

## 2024-02-14 DIAGNOSIS — R0789 Other chest pain: Secondary | ICD-10-CM | POA: Diagnosis not present

## 2024-02-14 LAB — CBC
HCT: 41.9 % (ref 36.0–46.0)
Hemoglobin: 14.6 g/dL (ref 12.0–15.0)
MCH: 30.9 pg (ref 26.0–34.0)
MCHC: 34.8 g/dL (ref 30.0–36.0)
MCV: 88.6 fL (ref 80.0–100.0)
Platelets: 212 K/uL (ref 150–400)
RBC: 4.73 MIL/uL (ref 3.87–5.11)
RDW: 12.2 % (ref 11.5–15.5)
WBC: 4.4 K/uL (ref 4.0–10.5)
nRBC: 0 % (ref 0.0–0.2)

## 2024-02-14 LAB — TROPONIN I (HIGH SENSITIVITY)
Troponin I (High Sensitivity): <2 ng/L (ref <18)
Troponin I (High Sensitivity): 3 ng/L (ref <18)

## 2024-02-14 LAB — BASIC METABOLIC PANEL WITH GFR
Anion gap: 14 (ref 5–15)
BUN: 13 mg/dL (ref 8–23)
CO2: 23 mmol/L (ref 22–32)
Calcium: 9.4 mg/dL (ref 8.9–10.3)
Chloride: 104 mmol/L (ref 98–111)
Creatinine, Ser: 0.53 mg/dL (ref 0.44–1.00)
GFR, Estimated: >60 mL/min (ref >60)
Glucose, Bld: 95 mg/dL (ref 70–99)
Potassium: 3.6 mmol/L (ref 3.5–5.1)
Sodium: 141 mmol/L (ref 135–145)

## 2024-02-14 NOTE — Discharge Instructions (Signed)
 As we discussed, reach out to Dr. Darliss to be seen in the clinic and discuss possible stress test due to your episode of chest pain.  If you have any worsening symptoms please return to the ED

## 2024-02-14 NOTE — ED Provider Notes (Signed)
 [facility] Provider Note    Event Date/Time   First MD Initiated Contact with Patient 02/14/24 1357     (approximate)   History   Chest Pain   HPI  Brianna Tanner is a 63 y.o. female who presents to the ED for evaluation of Chest Pain   I review a cardiology clinic visit from 1 year ago.  Referred to cardiology due to an outpatient EKG demonstrating a right bundle.  Subsequent outpatient echocardiogram obtained without evidence of acute pathology.  Patient presents to the ED with her husband for evaluation of a resolving episode of chest pain that occurred this morning.  She woke up feeling normal, and around 9 AM she was seated at a table and developed an aching burning sensation to her substernal chest that radiated up to her bilateral neck/jaw.  She reports it has since subsided but it was an unusual episodes that she presents to the ED.  Feeling better now.   Physical Exam   Triage Vital Signs: ED Triage Vitals  Encounter Vitals Group     BP 02/14/24 1136 (!) 169/91     Girls Systolic BP Percentile --      Girls Diastolic BP Percentile --      Boys Systolic BP Percentile --      Boys Diastolic BP Percentile --      Pulse Rate 02/14/24 1136 77     Resp 02/14/24 1136 16     Temp 02/14/24 1136 98.3 F (36.8 C)     Temp Source 02/14/24 1136 Oral     SpO2 02/14/24 1136 98 %     Weight 02/14/24 1135 192 lb 14.4 oz (87.5 kg)     Height --      Head Circumference --      Peak Flow --      Pain Score 02/14/24 1135 3     Pain Loc --      Pain Education --      Exclude from Growth Chart --     Most recent vital signs: Vitals:   02/14/24 1136  BP: (!) 169/91  Pulse: 77  Resp: 16  Temp: 98.3 F (36.8 C)  SpO2: 98%    General: Awake, no distress.  CV:  Good peripheral perfusion.  RRR without appreciable murmur Resp:  Normal effort.  CTAB Abd:  No distention.  MSK:  No deformity noted.  Neuro:  No focal deficits  appreciated. Other:     ED Results / Procedures / Treatments   Labs (all labs ordered are listed, but only abnormal results are displayed) Labs Reviewed  BASIC METABOLIC PANEL WITH GFR  CBC  TROPONIN I (HIGH SENSITIVITY)  TROPONIN I (HIGH SENSITIVITY)    EKG Sinus rhythm with a rate of 79 bpm.  Right bundle, normal axis and intervals without STEMI.  RADIOLOGY CXR interpreted by me without evidence of acute cardiopulmonary pathology.  Official radiology report(s): DG Chest 2 View Result Date: 02/14/2024 CLINICAL DATA:  Chest pain radiating to the jaw. EXAM: CHEST - 2 VIEW COMPARISON:  June 22, 2017 FINDINGS: The heart size and mediastinal contours are within normal limits. There is no evidence of acute infiltrate, pleural effusion or pneumothorax. Radiopaque surgical clips are seen along the right axilla. The visualized skeletal structures are unremarkable. IMPRESSION: No active cardiopulmonary disease. Electronically Signed   By: Suzen Dials M.D.   On: 02/14/2024 13:11    PROCEDURES and INTERVENTIONS:  .1-3 Lead EKG Interpretation  Performed by: Claudene Rover, MD Authorized by: Claudene Rover, MD     Interpretation: normal     ECG rate:  70   ECG rate assessment: normal     Rhythm: sinus rhythm     Ectopy: none     Conduction: normal     Medications - No data to display   IMPRESSION / MDM / ASSESSMENT AND PLAN / ED COURSE  I reviewed the triage vital signs and the nursing notes.  Differential diagnosis includes, but is not limited to, ACS, PTX, PNA, muscle strain/spasm, PE, dissection, anxiety, pleural effusion  {Patient presents with symptoms of an acute illness or injury that is potentially life-threatening.  Patient without cardiac history but moderate risk by age presents for evaluation of an improving episode of atypical chest discomfort.  Nonischemic EKG and negative troponin.  Clear CXR, normal metabolic panel and CBC.  Awaiting second troponin with  anticipation that she will be suitable for outpatient management.  Suspect she would benefit from an outpatient stress test if this second troponin remains low.  Pain has not recurred.      FINAL CLINICAL IMPRESSION(S) / ED DIAGNOSES   Final diagnoses:  Other chest pain     Rx / DC Orders   ED Discharge Orders     None        Note:  This document was prepared using Dragon voice recognition software and may include unintentional dictation errors.   Claudene Rover, MD 02/14/24 [phone]

## 2024-02-14 NOTE — ED Triage Notes (Signed)
 STates c/o chest pain this morning, onset 0930.  Pain radiating up into jaw right and left side.  States BP also elevated at that time.  STates pain improving at this time.  Skin warm and dry. NAD

## 2024-02-16 ENCOUNTER — Encounter: Payer: Self-pay | Admitting: Cardiology

## 2024-02-16 ENCOUNTER — Ambulatory Visit: Attending: Cardiology | Admitting: Cardiology

## 2024-02-16 VITALS — BP 145/90 | HR 68 | Ht 67.0 in | Wt 194.2 lb

## 2024-02-16 DIAGNOSIS — I10 Essential (primary) hypertension: Secondary | ICD-10-CM | POA: Diagnosis not present

## 2024-02-16 DIAGNOSIS — R072 Precordial pain: Secondary | ICD-10-CM

## 2024-02-16 DIAGNOSIS — E782 Mixed hyperlipidemia: Secondary | ICD-10-CM

## 2024-02-16 MED ORDER — METOPROLOL TARTRATE 100 MG PO TABS: ORAL_TABLET | ORAL | 0 refills | Status: DC

## 2024-02-16 MED ORDER — ROSUVASTATIN CALCIUM 20 MG PO TABS: 20.0000 mg | ORAL_TABLET | Freq: Every day | ORAL | 3 refills | Status: DC

## 2024-02-16 NOTE — Patient Instructions (Signed)
 Medication Instructions:  - Take one 100 mg metoprolol  two hours prior to cardiac CT -START crestor  20 mg daily   *If you need a refill on your cardiac medications before your next appointment, please call your pharmacy*  Lab Work: Your provider would like for you to have following labs drawn today BMP.    If you have labs (blood work) drawn today and your tests are completely normal, you will receive your results only by: MyChart Message (if you have MyChart) OR A paper copy in the mail If you have any lab test that is abnormal or we need to change your treatment, we will call you to review the results.  Testing/Procedures:   Your cardiac CT will be scheduled at:  [facility] [address] [place], [place] [address] [phone]  Please arrive 15 mins early for check-in and test prep.  There is spacious parking and easy access to the radiology department from the [facility] entrance. Please enter here and check-in with the desk attendant.    Please follow these instructions carefully (unless otherwise directed):  An IV will be required for this test and Nitroglycerin will be given.  Hold all erectile dysfunction medications at least 3 days (72 hrs) prior to test. (Ie viagra, cialis, sildenafil, tadalafil, etc)     On the Night Before the Test: Be sure to Drink plenty of water. Do not consume any caffeinated/decaffeinated beverages or chocolate 12 hours prior to your test. Do not take any antihistamines 12 hours prior to your test.  On the Day of the Test: Drink plenty of water until 1 hour prior to the test. Do not eat any food 1 hour prior to test. You may take your regular medications prior to the test.  Take metoprolol  (Lopressor ) two hours prior to test. If you take Furosemide/Hydrochlorothiazide/Spironolactone/Chlorthalidone, please HOLD on the morning of the test. Patients who wear a continuous glucose monitor MUST remove  the device prior to scanning. FEMALES- please wear underwire-free bra if available, avoid dresses & tight clothing       After the Test: Drink plenty of water. After receiving IV contrast, you may experience a mild flushed feeling. This is normal. On occasion, you may experience a mild rash up to 24 hours after the test. This is not dangerous. If this occurs, you can take Benadryl 25 mg, Zyrtec, Claritin, or Allegra and increase your fluid intake. (Patients taking Tikosyn should avoid Benadryl, and may take Zyrtec, Claritin, or Allegra) If you experience trouble breathing, this can be serious. If it is severe call 911 IMMEDIATELY. If it is mild, please call our office.  We will call to schedule your test 2-4 weeks out understanding that some insurance companies will need an authorization prior to the service being performed.   For more information and frequently asked questions, please visit our website : [email or web address]  For non-scheduling related questions, please contact the cardiac imaging nurse navigator should you have any questions/concerns: Cardiac Imaging Nurse Navigators Direct Office Dial: [phone]   For scheduling needs, including cancellations and rescheduling, please call [place], [phone].    Follow-Up: At [facility], you and your health needs are our priority.  As part of our continuing mission to provide you with exceptional heart care, our providers are all part of one team.  This team includes your primary Cardiologist (physician) and Advanced Practice Providers or APPs (Physician Assistants and Nurse Practitioners) who all work together to provide you with the  care you need, when you need it.  Your next appointment:   2 month(s)  Provider:   You may see Redell Cave, MD or one of the following Advanced Practice Providers on your designated Care Team:   Lonni Meager, NP Lesley Maffucci, PA-C Bernardino Bring, PA-C Cadence Kellyton,  PA-C Tylene Lunch, NP Barnie Hila, NP    We recommend signing up for the patient portal called MyChart.  Sign up information is provided on this After Visit Summary.  MyChart is used to connect with patients for Virtual Visits (Telemedicine).  Patients are able to view lab/test results, encounter notes, upcoming appointments, etc.  Non-urgent messages can be sent to your provider as well.   To learn more about what you can do with MyChart, go to [email or web address].
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
## 2024-02-17 LAB — BASIC METABOLIC PANEL WITH GFR
BUN/Creatinine Ratio: 18 (ref 12–28)
BUN: 13 mg/dL (ref 8–27)
CO2: 21 mmol/L (ref 20–29)
Calcium: 7.7 mg/dL — ABNORMAL LOW (ref 8.7–10.3)
Chloride: 101 mmol/L (ref 96–106)
Creatinine, Ser: 0.71 mg/dL (ref 0.57–1.00)
Glucose: 96 mg/dL (ref 70–99)
Potassium: 4.6 mmol/L (ref 3.5–5.2)
Sodium: 140 mmol/L (ref 134–144)
eGFR: 95 mL/min/1.73 (ref >59)

## 2024-02-28 ENCOUNTER — Encounter (HOSPITAL_COMMUNITY): Payer: Self-pay

## 2024-03-02 ENCOUNTER — Ambulatory Visit
Admission: RE | Admit: 2024-03-02 | Discharge: 2024-03-02 | Disposition: A | Discharge status: Home / Self Care | Source: Ambulatory Visit | Attending: Cardiology | Admitting: Cardiology

## 2024-03-02 DIAGNOSIS — R072 Precordial pain: Secondary | ICD-10-CM | POA: Diagnosis present

## 2024-03-02 MED ORDER — NITROGLYCERIN 0.4 MG SL SUBL
0.8000 mg | SUBLINGUAL_TABLET | Freq: Once | SUBLINGUAL | Status: AC
  Administered 2024-03-02: 0.8 mg via SUBLINGUAL

## 2024-03-02 MED ORDER — IOHEXOL 350 MG/ML SOLN
100.0000 mL | Freq: Once | INTRAVENOUS | Status: AC | PRN
  Administered 2024-03-02: 100 mL via INTRAVENOUS

## 2024-03-02 MED ORDER — NITROGLYCERIN 0.4 MG SL SUBL
0.8000 mg | SUBLINGUAL_TABLET | Freq: Once | SUBLINGUAL | Status: DC
  Filled 2024-03-02: qty 25

## 2024-03-02 MED ORDER — METOPROLOL TARTRATE 5 MG/5ML IV SOLN: 10.0000 mg | Freq: Once | INTRAVENOUS | Status: DC | PRN

## 2024-03-02 MED ORDER — DILTIAZEM HCL 25 MG/5ML IV SOLN: 10.0000 mg | INTRAVENOUS | Status: DC | PRN

## 2024-03-03 ENCOUNTER — Ambulatory Visit: Payer: Self-pay | Admitting: Cardiology

## 2024-03-06 ENCOUNTER — Ambulatory Visit
Admission: RE | Admit: 2024-03-06 | Discharge: 2024-03-06 | Disposition: A | Discharge status: Home / Self Care | Source: Ambulatory Visit | Attending: Primary Care | Admitting: Primary Care

## 2024-03-06 DIAGNOSIS — Z1231 Encounter for screening mammogram for malignant neoplasm of breast: Secondary | ICD-10-CM | POA: Insufficient documentation

## 2024-03-08 ENCOUNTER — Ambulatory Visit: Payer: Self-pay | Admitting: Primary Care

## 2024-04-19 ENCOUNTER — Ambulatory Visit: Admitting: Cardiology

## 2024-04-24 ENCOUNTER — Ambulatory Visit: Admitting: Physician Assistant

## 2024-05-24 ENCOUNTER — Other Ambulatory Visit: Payer: Self-pay | Admitting: Primary Care

## 2024-05-24 DIAGNOSIS — M353 Polymyalgia rheumatica: Secondary | ICD-10-CM

## 2024-05-25 ENCOUNTER — Ambulatory Visit: Admitting: Primary Care

## 2024-05-25 ENCOUNTER — Encounter: Payer: Self-pay | Admitting: Primary Care

## 2024-05-25 VITALS — BP 168/98 | HR 68 | Temp 97.9°F | Ht 67.0 in | Wt 190.1 lb

## 2024-05-25 DIAGNOSIS — R030 Elevated blood-pressure reading, without diagnosis of hypertension: Secondary | ICD-10-CM

## 2024-05-25 DIAGNOSIS — R7303 Prediabetes: Secondary | ICD-10-CM | POA: Diagnosis not present

## 2024-05-25 DIAGNOSIS — E782 Mixed hyperlipidemia: Secondary | ICD-10-CM | POA: Diagnosis not present

## 2024-05-25 DIAGNOSIS — I10 Essential (primary) hypertension: Secondary | ICD-10-CM | POA: Insufficient documentation

## 2024-05-25 LAB — LIPID PANEL
Cholesterol: 217 mg/dL — ABNORMAL HIGH (ref 0–200)
HDL: 48.5 mg/dL (ref >39.00)
LDL Cholesterol: 123 mg/dL — ABNORMAL HIGH (ref 0–99)
NonHDL: 168.69
Total CHOL/HDL Ratio: 4
Triglycerides: 230 mg/dL — ABNORMAL HIGH (ref 0.0–149.0)
VLDL: 46 mg/dL — ABNORMAL HIGH (ref 0.0–40.0)

## 2024-05-25 LAB — BASIC METABOLIC PANEL WITH GFR
BUN: 16 mg/dL (ref 6–23)
CO2: 30 meq/L (ref 19–32)
Calcium: 8.9 mg/dL (ref 8.4–10.5)
Chloride: 102 meq/L (ref 96–112)
Creatinine, Ser: 0.69 mg/dL (ref 0.40–1.20)
GFR: 92.46 mL/min (ref >60.00)
Glucose, Bld: 99 mg/dL (ref 70–99)
Potassium: 4.4 meq/L (ref 3.5–5.1)
Sodium: 140 meq/L (ref 135–145)

## 2024-05-25 LAB — HEMOGLOBIN A1C: Hgb A1c MFr Bld: 5.6 % (ref 4.6–6.5)
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
## 2024-05-25 NOTE — Assessment & Plan Note (Signed)
 Suspect that she has primary hypertension, especially given personal and family history.  She would like a trial off of her Cymbalta  to learn if this is the culprit. Stop Cymbalta  20 mg.  I have asked that she send blood pressure readings via MyChart in 2 weeks.  If blood pressure readings are still above goal we will initiate treatment.  Commended her on her healthier lifestyle. Reviewed CT coronary scan for August 2025. Repeat lipid panel pending.

## 2024-05-25 NOTE — Patient Instructions (Signed)
 Stop by the lab prior to leaving today. I will notify you of your results once received.   Stop taking Cymbalta  as discussed.  Please send new blood pressure readings via MyChart in 2 weeks.  It was a pleasure to see you today!

## 2024-05-26 ENCOUNTER — Ambulatory Visit: Payer: Self-pay | Admitting: Primary Care

## 2024-05-26 DIAGNOSIS — I10 Essential (primary) hypertension: Secondary | ICD-10-CM

## 2024-06-01 ENCOUNTER — Ambulatory Visit: Admitting: Primary Care

## 2024-06-12 NOTE — Telephone Encounter (Signed)
 My chart sent to let patient know pcp out of office.

## 2024-06-14 MED ORDER — LISINOPRIL-HYDROCHLOROTHIAZIDE 10-12.5 MG PO TABS: 1.0000 | ORAL_TABLET | Freq: Every day | ORAL | 0 refills | Status: DC

## 2024-06-14 NOTE — Addendum Note (Signed)
 Addended by: Robbie Rideaux K on: 06/14/2024 09:34 PM   Modules accepted: Orders

## 2024-07-13 ENCOUNTER — Other Ambulatory Visit: Payer: Self-pay | Admitting: Primary Care

## 2024-07-13 DIAGNOSIS — I10 Essential (primary) hypertension: Secondary | ICD-10-CM

## 2024-07-14 NOTE — Telephone Encounter (Signed)
 Patient is due for a blood pressure follow up, this will be required prior to any further refills.  Please schedule, thank you!

## 2024-07-17 ENCOUNTER — Telehealth: Payer: Self-pay | Admitting: Primary Care

## 2024-07-17 DIAGNOSIS — I10 Essential (primary) hypertension: Secondary | ICD-10-CM

## 2024-07-17 NOTE — Telephone Encounter (Unsigned)
 Copied from CRM #[id number]. Topic: Appointments - Scheduling Inquiry for Clinic >> Jul 17, 2024  8:14 AM Jeoffrey H wrote: Reason for CRM: Patient stated she will be out of her lisinopril -hydrochlorothiazide  before 07/26/2024 which is the next available appt for pcp. Has to work on 07/26/2024 also. Wanted to know if she could just come in for a urine sample without an appt. I advised patient that I did not see where her provider mentioned she needed a urine sample but that she needed a follow up for her blood pressure. Please contact patient for any earlier appts for med refill. Only has 5 days left before she runs out of med.    Brianna Tanner- [phone]

## 2024-07-17 NOTE — Telephone Encounter (Signed)
 Noted

## 2024-07-17 NOTE — Telephone Encounter (Signed)
 I called and spoked with patient and she has been scheduled.

## 2024-07-18 MED ORDER — LISINOPRIL-HYDROCHLOROTHIAZIDE 10-12.5 MG PO TABS: 1.0000 | ORAL_TABLET | Freq: Every day | ORAL | 0 refills | Status: DC

## 2024-07-18 NOTE — Telephone Encounter (Signed)
 Will provide short term refill until she can get to her appointment. Yes, she needs office visit for urine testing. We can see her sooner for both BP follow up and urine testing if she would like.

## 2024-07-18 NOTE — Telephone Encounter (Signed)
 Spoke with pt relaying Brianna Tanner's message. Pt verbalizes understanding and will wait until 08/02/23 OV to do everything.

## 2024-08-01 ENCOUNTER — Encounter: Payer: Self-pay | Admitting: Primary Care

## 2024-08-01 ENCOUNTER — Ambulatory Visit: Admitting: Primary Care

## 2024-08-01 VITALS — BP 136/82 | HR 75 | Temp 98.2°F | Ht 67.0 in | Wt 188.0 lb

## 2024-08-01 DIAGNOSIS — I10 Essential (primary) hypertension: Secondary | ICD-10-CM | POA: Diagnosis not present

## 2024-08-01 NOTE — Assessment & Plan Note (Addendum)
 Improved but not quite at goal.  Commended her on weight loss. We agree to increase lisinopril -hydrochlorothiazide  to 20-12.5 mg daily. Wait BMP results first. She will continue to monitor her BP once we increase her dose.   BMP pending.

## 2024-08-01 NOTE — Patient Instructions (Signed)
 Stop by the lab prior to leaving today. I will notify you of your results once received.   We increased your blood pressure medication to lisinopril -hydrochlorothiazide  20-12.5 mg daily.  I will send a new prescription once we have your lab results.  It was a pleasure to see you today!
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
## 2024-08-02 ENCOUNTER — Ambulatory Visit: Payer: Self-pay | Admitting: Primary Care

## 2024-08-02 DIAGNOSIS — I10 Essential (primary) hypertension: Secondary | ICD-10-CM

## 2024-08-02 LAB — BASIC METABOLIC PANEL WITH GFR
BUN: 10 mg/dL (ref 6–23)
CO2: 28 meq/L (ref 19–32)
Calcium: 9.4 mg/dL (ref 8.4–10.5)
Chloride: 101 meq/L (ref 96–112)
Creatinine, Ser: 0.65 mg/dL (ref 0.40–1.20)
GFR: 93.67 mL/min
Glucose, Bld: 82 mg/dL (ref 70–99)
Potassium: 4.1 meq/L (ref 3.5–5.1)
Sodium: 138 meq/L (ref 135–145)

## 2024-08-02 MED ORDER — LISINOPRIL-HYDROCHLOROTHIAZIDE 20-12.5 MG PO TABS: 1.0000 | ORAL_TABLET | Freq: Every day | ORAL | 2 refills | Status: AC
